# Patient Record
Sex: Female | Born: 1970 | Race: White | Hispanic: No | Marital: Married | Smoking: Former smoker
Health system: Southern US, Community
[De-identification: ages and names within clinical notes are randomized; demographics above are authoritative.]

## PROBLEM LIST (undated history)

## (undated) DIAGNOSIS — J302 Other seasonal allergic rhinitis: Secondary | ICD-10-CM

## (undated) DIAGNOSIS — D171 Benign lipomatous neoplasm of skin and subcutaneous tissue of trunk: Secondary | ICD-10-CM

## (undated) DIAGNOSIS — F419 Anxiety disorder, unspecified: Secondary | ICD-10-CM

## (undated) DIAGNOSIS — J45909 Unspecified asthma, uncomplicated: Secondary | ICD-10-CM

## (undated) DIAGNOSIS — N63 Unspecified lump in unspecified breast: Secondary | ICD-10-CM

## (undated) DIAGNOSIS — K900 Celiac disease: Secondary | ICD-10-CM

## (undated) DIAGNOSIS — D649 Anemia, unspecified: Secondary | ICD-10-CM

## (undated) DIAGNOSIS — E785 Hyperlipidemia, unspecified: Secondary | ICD-10-CM

## (undated) HISTORY — PX: BREAST SURGERY: SHX581

## (undated) HISTORY — DX: Hyperlipidemia, unspecified: E78.5

## (undated) HISTORY — PX: WISDOM TOOTH EXTRACTION: SHX21

## (undated) HISTORY — DX: Unspecified asthma, uncomplicated: J45.909

## (undated) HISTORY — DX: Anemia, unspecified: D64.9

## (undated) HISTORY — DX: Other seasonal allergic rhinitis: J30.2

---

## 2004-06-13 ENCOUNTER — Other Ambulatory Visit: Admission: RE | Admit: 2004-06-13 | Discharge: 2004-06-13 | Payer: Self-pay | Admitting: Obstetrics and Gynecology

## 2005-02-05 ENCOUNTER — Encounter: Admission: RE | Admit: 2005-02-05 | Discharge: 2005-05-06 | Payer: Self-pay | Admitting: Neurosurgery

## 2005-02-11 HISTORY — PX: PLACEMENT OF BREAST IMPLANTS: SHX6334

## 2008-09-07 ENCOUNTER — Inpatient Hospital Stay (HOSPITAL_COMMUNITY): Admission: AD | Admit: 2008-09-07 | Discharge: 2008-09-10 | Payer: Self-pay | Admitting: Obstetrics and Gynecology

## 2009-02-11 HISTORY — PX: COLONOSCOPY: SHX174

## 2009-05-23 ENCOUNTER — Ambulatory Visit: Payer: Self-pay | Admitting: Gastroenterology

## 2009-05-23 ENCOUNTER — Encounter (INDEPENDENT_AMBULATORY_CARE_PROVIDER_SITE_OTHER): Payer: Self-pay | Admitting: *Deleted

## 2009-05-23 DIAGNOSIS — K625 Hemorrhage of anus and rectum: Secondary | ICD-10-CM | POA: Insufficient documentation

## 2009-05-23 DIAGNOSIS — D509 Iron deficiency anemia, unspecified: Secondary | ICD-10-CM | POA: Insufficient documentation

## 2009-05-31 ENCOUNTER — Telehealth: Payer: Self-pay | Admitting: Gastroenterology

## 2009-06-19 ENCOUNTER — Telehealth: Payer: Self-pay | Admitting: Gastroenterology

## 2009-06-22 ENCOUNTER — Ambulatory Visit (HOSPITAL_COMMUNITY): Admission: RE | Admit: 2009-06-22 | Discharge: 2009-06-22 | Payer: Self-pay | Admitting: Gastroenterology

## 2009-06-22 ENCOUNTER — Ambulatory Visit: Payer: Self-pay | Admitting: Gastroenterology

## 2009-06-26 DIAGNOSIS — K900 Celiac disease: Secondary | ICD-10-CM

## 2010-03-13 NOTE — Progress Notes (Signed)
Summary: Triage  Phone Note Call from Patient Call back at Home Phone [phone] Call back at [phone] Cell   Caller: Patient Call For: Dr. Ardis Hughs Reason for Call: Talk to Nurse Summary of Call: Wants to know if the "pill form" can be prescribed instead of the Moviprep Initial call taken by: Webb Laws,  May 31, 2009 8:08 AM  Follow-up for Phone Call        Explained that their is a kidney risk to the Osmo prep.  She agreed to use the Movi Prep Follow-up by: Christian Mate CMA Deborra Medina),  May 31, 2009 9:16 AM

## 2010-03-13 NOTE — Procedures (Signed)
Summary: Upper Endoscopy  Patient: [name] Note: All result statuses are Final unless otherwise noted.  Tests: (1) Upper Endoscopy (EGD)   EGD Upper Endoscopy       DONE     [facility]     [place], [place]  [address]           ENDOSCOPY PROCEDURE REPORT           PATIENT:  Denise Arellano, Denise Arellano  MR#:  [id number]     BIRTHDATE:  [birth year], 38 yrs. old  GENDER:  female     ENDOSCOPIST:  Milus Banister, MD     Referred by:  Janalyn Rouse, M.D.     PROCEDURE DATE:  06/22/2009     PROCEDURE:  EGD with biopsy     ASA CLASS:  Class I     INDICATIONS:  anemia, iron def., eqivocal Celiac Sprue markers     MEDICATIONS:  MAC sedation, administered by CRNA     TOPICAL ANESTHETIC:  none           DESCRIPTION OF PROCEDURE:   After the risks benefits and     alternatives of the procedure were thoroughly explained, informed     consent was obtained.  The  endoscope was introduced through the     mouth and advanced to the second portion of the duodenum, without     limitations.  The instrument was slowly withdrawn as the mucosa     was fully examined.     <<PROCEDUREIMAGES>>     The duodenal mucosa was slightly abnormal appearing, atrophic but     not clearly "scallopped." Biopsies were taken to check for Celiac     Sprue (see image3).  Otherwise the examination was normal (see     image1, image2, image3, and image5).    Retroflexed views revealed     no abnormalities.    The scope was then withdrawn from the patient     and the procedure completed.           COMPLICATIONS:  None           ENDOSCOPIC IMPRESSION:     1) Slightly abnormal appearing duodenal mucosa; biopsies taken     to check for Celiac Sprue     2) Otherwise normal examination           RECOMMENDATIONS:     If biopsies confirm Celiac Sprue, she will be referred to     dietician to educate on gluten avoidance.           ______________________________     Milus Banister, MD          n.     eSIGNED:   Milus Banister at 06/22/2009 09:51 AM           Milana Huntsman, [id number]  Note: An exclamation mark (!) indicates a result that was not dispersed into the flowsheet. Document Creation Date: 06/22/2009 9:51 AM _______________________________________________________________________  (1) Order result status: Final Collection or observation date-time: 06/22/2009 09:45 Requested date-time:  Receipt date-time:  Reported date-time:  Referring Physician:   Ordering Physician: Owens Loffler [phone]) Specimen Source:  Source: Tawanna Cooler Order Number: [id number] Lab site:

## 2010-03-13 NOTE — Letter (Signed)
Summary: [facility] Instructions  [facility]  [place], [place] [address]   Phone: [phone]  Fax: [phone]       VERDELLE VALTIERRA    11-28-1970    MRN: [id number]        Procedure Day /Date:06/22/09 THUR     Arrival Time:830 am     Procedure Time:1030 am     Location of Procedure:                     X  [facility] ( Outpatient Registration)                        [place]   Starting 5 days prior to your procedure 06/17/09  do not eat nuts, seeds, popcorn, corn, beans, peas,  salads, or any raw vegetables.  Do not take any fiber supplements (e.g. Metamucil, Citrucel, and Benefiber).  THE DAY BEFORE YOUR PROCEDURE         DATE: 06/21/09   DAY: WED  1.  Drink clear liquids the entire day-NO SOLID FOOD  2.  Do not drink anything colored red or purple.  Avoid juices with pulp.  No orange juice.  3.  Drink at least 64 oz. (8 glasses) of fluid/clear liquids during the day to prevent dehydration and help the prep work efficiently.  CLEAR LIQUIDS INCLUDE: Water Jello Ice Popsicles Tea (sugar ok, no milk/cream) Powdered fruit flavored drinks Coffee (sugar ok, no milk/cream) Gatorade Juice: apple, white grape, white cranberry  Lemonade Clear bullion, consomm, broth Carbonated beverages (any kind) Strained chicken noodle soup Hard Candy                             4.  In the morning, mix first dose of MoviPrep solution:    Empty 1 Pouch A and 1 Pouch B into the disposable container    Add lukewarm drinking water to the top line of the container. Mix to dissolve    Refrigerate (mixed solution should be used within 24 hrs)  5.  Begin drinking the prep at 5:00 p.m. The MoviPrep container is divided by 4 marks.   Every 15 minutes drink the solution down to the next mark (approximately 8 oz) until the full liter is complete.   6.  Follow completed prep with 16 oz of clear liquid of your choice (Nothing  red or purple).  Continue to drink clear liquids until bedtime.  7.  Before going to bed, mix second dose of MoviPrep solution:    Empty 1 Pouch A and 1 Pouch B into the disposable container    Add lukewarm drinking water to the top line of the container. Mix to dissolve    Refrigerate  THE DAY OF YOUR PROCEDURE      DATE: 06/22/09 DAY: THURS  Beginning at 530 a.m. (5 hours before procedure):         1. Every 15 minutes, drink the solution down to the next mark (approx 8 oz) until the full liter is complete.  NOTHING TO EAT OR DRINK AFTER MIDNIGHT EXCEPT PREP  MEDICATION INSTRUCTIONS  Unless otherwise instructed, you should take regular prescription medications with a small sip of water   as early as possible the morning of your procedure.           OTHER INSTRUCTIONS  You will need a responsible adult  at least 38 years of age to accompany you and drive you home.   This person must remain in the waiting room during your procedure.  Wear loose fitting clothing that is easily removed.  Leave jewelry and other valuables at home.  However, you may wish to bring a book to read or  an iPod/MP3 player to listen to music as you wait for your procedure to start.  Remove all body piercing jewelry and leave at home.  Total time from sign-in until discharge is approximately 2-3 hours.  You should go home directly after your procedure and rest.  You can resume normal activities the  day after your procedure.  The day of your procedure you should not:   Drive   Make legal decisions   Operate machinery   Drink alcohol   Return to work  You will receive specific instructions about eating, activities and medications before you leave.    The above instructions have been reviewed and explained to me by   _______________________    I fully understand and can verbalize these instructions _____________________________ Date _________

## 2010-03-13 NOTE — Procedures (Signed)
Summary: Instructions for procedure/[facility] (out pt)  Instructions for procedure/[facility] (out pt)   Imported By: Phillis Knack 05/29/2009 07:23:51  _____________________________________________________________________  External Attachment:    Type:   Image     Comment:   External Document

## 2010-03-13 NOTE — Progress Notes (Signed)
Summary: prep ?'s  Phone Note Call from Patient Call back at Home Phone [phone]   Caller: Patient Call For: Dr. Ardis Hughs Reason for Call: Talk to Nurse Summary of Call: has prep ?'s... ECL Thursday Initial call taken by: Lucien Mons,  Jun 19, 2009 9:48 AM  Follow-up for Phone Call        left message on machine to call back  [place] Deborra Medina)  Jun 19, 2009 9:58 AM   left message on machine to call back [place] Deborra Medina)  Jun 19, 2009 4:38 PM   left message on machine to call back [place] Deborra Medina)  Jun 20, 2009 10:54 AM

## 2010-03-13 NOTE — Assessment & Plan Note (Signed)
History of Present Illness Visit Type: Initial Consult Primary GI MD: Owens Loffler MD Primary Provider: Lutricia Feil, MD Requesting Provider: Lutricia Feil, MD Chief Complaint: Iron Def Anemia, ? sprue History of Present Illness:     very pleasant 40 year old woman who has "always had low iron" maybe as far back as 20 years ago.  never too low to be anemic.  Has 'regular' mentral cycle.  Has red rectal bleeding with BM about 3 times a year (she feels a hemorrhoidal bump at that time, has anal discomfort).  Once it was thrombosed and had it lanced by Dr. Stark Klein.  Is generally a bit constipated.  but fluctuates.  she had recent blood tests by her primary care physician again documenting low ferritin despite iron replacement. Her ferritin was 5.8. Her hemoglobin was normal her MCV was normal. TTG, IgG antibodies were normal, and the endomysial antibodies were positive at a one:160 titer.  has had some hair loss, has usually been around time of pregnancy.  Saw dermatologist.  Has been an issue for years, seems like more loss lately.           Current Medications (verified): 1)  Ferrex 150 Forte 150-25-1 Mg-Mcg-Mg Caps (Iron Polysacch Cmplx-B12-Fa) .... Take One By Mouth Two Times A Day 2)  Venlafaxine Hcl 75 Mg Xr24h-Cap (Venlafaxine Hcl) .... Take One By Mouth Once Daily 3)  Claritin 10 Mg Tabs (Loratadine) .... Take One By Mouth Once Daily As Needed  Allergies (verified): 1)  ! Asa 2)  ! 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  Review of Systems       Pertinent positive and negative  review of systems were noted in the above HPI and GI specific review of systems.  All other review of systems was otherwise negative.   Vital Signs:  Patient profile:   40 year old female Height:      61 inches Weight:      104.8 pounds BMI:     19.87 Pulse rate:   82 / minute Pulse rhythm:   regular BP sitting:   104 / 58  (left arm) Cuff size:   regular  Vitals Entered By: Bernita Buffy CMA Deborra Medina) (May 23, 2009 9:32 AM)  Physical Exam  Additional Exam:  Constitutional: generally well appearing Psychiatric: alert and oriented times 3 Eyes: extraocular movements intact Mouth: oropharynx moist, no lesions Neck: supple, no lymphadenopathy Cardiovascular: heart regular rate and rythm Lungs: CTA bilaterally Abdomen: soft, non-tender, non-distended, no obvious ascites, no peritoneal signs, normal bowel sounds Extremities: no lower extremity edema bilaterally Skin: no lesions on visible extremities    Impression & Recommendations:  Problem # 1:  Iron deficiency, intermittent rectal bleeding, abnormal celiac sprue testing 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 She has seen intermittent rectal bleeding. This occurs about 3 times a year. It is almost certainly hemorrhoidal however I think we should proceed with full colonoscopy at the same time as her upper endoscopy to make sure there is nothing more serious going on such as neoplasm. This will also be important in  case she does not have sprue proven by biopsy. We will do this at [facility], propofol sedation. She thinks she'll be very difficult to sedate given her anxiety  Patient Instructions: 1)  You will be scheduled to have a colonoscopy at [facility] with anesthesia assistance. 2)  You will be scheduled to have an upper endoscopy. 3)  A copy of this information will be sent to Dr. Lang Snow. 4)  The medication list was reviewed and  reconciled.  All changed / newly prescribed medications were explained.  A complete medication list was provided to the patient / caregiver.  Appended Document: Orders Update/movi    Clinical Lists Changes  Problems: Added new problem of RECTAL BLEEDING (ICD-569.3) Added new problem of ANEMIA, IRON DEFICIENCY (ICD-280.9) Medications: Added new medication of MOVIPREP 100 GM  SOLR (PEG-KCL-NACL-NASULF-NA ASC-C) As per prep instructions. - Signed Rx of MOVIPREP 100 GM  SOLR (PEG-KCL-NACL-NASULF-NA ASC-C) As per prep instructions.;  #1 x 0;  Signed;  Entered by: Christian Mate CMA (AAMA);  Authorized by: Milus Banister MD;  Method used: Electronically to [facility]  [phone]*, [address] [place], [place]  [address], Ph: [phone] or [phone], Fax: [phone] Orders: Added new Test order of [facility] - Signed    Prescriptions: MOVIPREP 100 GM  SOLR (PEG-KCL-NACL-NASULF-NA ASC-C) As per prep instructions.  #1 x 0   Entered by:   Christian Mate CMA ([place])   Authorized by:   Milus Banister MD   Signed by:   Christian Mate CMA ([place]) on 05/23/2009   Method used:   Electronically to        [facility]  [phone]* (retail)       [place], [place]  [address]       Ph: [phone] or [phone]       Fax: [phone]   RxID:   [id number]

## 2010-03-13 NOTE — Procedures (Signed)
Summary: Colonoscopy  Patient: Denise Arellano Note: All result statuses are Final unless otherwise noted.  Tests: (1) Colonoscopy (COL)   COL Colonoscopy           [facility]     [place], [place]  [address]           COLONOSCOPY PROCEDURE REPORT           PATIENT:  Denise, Arellano  MR#:  [id number]     BIRTHDATE:  [birth year], 38 yrs. old  GENDER:  female     ENDOSCOPIST:  Milus Banister, MD     PROCEDURE DATE:  06/22/2009     PROCEDURE:  Colonoscopy with biopsy     ASA CLASS:  Class I     INDICATIONS:  intermittent rectal bleeding, IDA     MEDICATIONS:   MAC sedation, administered by CRNA           DESCRIPTION OF PROCEDURE:   After the risks benefits and     alternatives of the procedure were thoroughly explained, informed     consent was obtained.  Digital rectal exam was performed and     revealed no rectal masses.   The  endoscope was introduced through     the anus and advanced to the terminal ileum which was intubated     for a short distance, without limitations.  The quality of the     prep was good, using MoviPrep.  The instrument was then slowly     withdrawn as the colon was fully examined.     <<PROCEDUREIMAGES>>           FINDINGS:  Internal hemorrhoids were found. These were small, not     thrombosed.  The terminal ileum appeared normal (see image3).     This was otherwise a normal examination of the colon. Random     biopsies were taken from colon to check for microscopic colitis     (sent to path) (see image2 and image4).   Retroflexed views in the     rectum revealed no abnormalities.    The scope was then withdrawn     from the patient and the procedure completed.           COMPLICATIONS:  None           ENDOSCOPIC IMPRESSION:     1) Small, internal hemorrhoids. These are the likely source of     her mild intermittent rectal bleeding.     2) Normal terminal ileum     3) Otherwise normal examination; biopsies taken  to check for     microsopic colitis           RECOMMENDATIONS:     Will proceed with EGD now.     If biopsies show microscopic colitis, will appropriately treat.                 ______________________________     Milus Banister, MD           cc: Lang Snow, MD           n.     eSIGNED:   Milus Banister at 06/22/2009 09:56 AM           Milana Huntsman, [id number]  Note: An exclamation mark (!) indicates a result that was not dispersed into the flowsheet. Document Creation Date: 06/22/2009 9:56 AM _______________________________________________________________________  (1) Order  result status: Final Collection or observation date-time: 06/22/2009 09:37 Requested date-time:  Receipt date-time:  Reported date-time:  Referring Physician:   Ordering Physician: Owens Loffler [phone]) Specimen Source:  Source: Tawanna Cooler Order Number: [id number] Lab site:

## 2010-05-20 LAB — CBC
HCT: 32.4 % — ABNORMAL LOW (ref 36.0–46.0)
Hemoglobin: 10.6 g/dL — ABNORMAL LOW (ref 12.0–15.0)
Hemoglobin: 9 g/dL — ABNORMAL LOW (ref 12.0–15.0)
MCHC: 32.7 g/dL (ref 30.0–36.0)
MCV: 77.8 fL — ABNORMAL LOW (ref 78.0–100.0)
Platelets: 376 K/uL (ref 150–400)
RBC: 3.51 MIL/uL — ABNORMAL LOW (ref 3.87–5.11)
RBC: 4.17 MIL/uL (ref 3.87–5.11)
RDW: 16.4 % — ABNORMAL HIGH (ref 11.5–15.5)
WBC: 14.1 K/uL — ABNORMAL HIGH (ref 4.0–10.5)
WBC: 16.3 10*3/uL — ABNORMAL HIGH (ref 4.0–10.5)

## 2010-05-20 LAB — RPR: RPR Ser Ql: NONREACTIVE

## 2012-04-07 ENCOUNTER — Other Ambulatory Visit: Payer: Self-pay | Admitting: Obstetrics and Gynecology

## 2013-09-08 ENCOUNTER — Other Ambulatory Visit: Payer: Self-pay | Admitting: Obstetrics and Gynecology

## 2013-09-09 LAB — CYTOLOGY - PAP

## 2015-08-04 DIAGNOSIS — H6123 Impacted cerumen, bilateral: Secondary | ICD-10-CM | POA: Diagnosis not present

## 2015-10-03 DIAGNOSIS — Z6821 Body mass index (BMI) 21.0-21.9, adult: Secondary | ICD-10-CM | POA: Diagnosis not present

## 2015-10-03 DIAGNOSIS — J208 Acute bronchitis due to other specified organisms: Secondary | ICD-10-CM | POA: Diagnosis not present

## 2016-03-01 DIAGNOSIS — J00 Acute nasopharyngitis [common cold]: Secondary | ICD-10-CM | POA: Diagnosis not present

## 2016-03-01 DIAGNOSIS — J111 Influenza due to unidentified influenza virus with other respiratory manifestations: Secondary | ICD-10-CM | POA: Diagnosis not present

## 2016-03-20 DIAGNOSIS — M9905 Segmental and somatic dysfunction of pelvic region: Secondary | ICD-10-CM | POA: Diagnosis not present

## 2016-03-20 DIAGNOSIS — M9903 Segmental and somatic dysfunction of lumbar region: Secondary | ICD-10-CM | POA: Diagnosis not present

## 2016-03-20 DIAGNOSIS — M25551 Pain in right hip: Secondary | ICD-10-CM | POA: Diagnosis not present

## 2016-03-20 DIAGNOSIS — M5136 Other intervertebral disc degeneration, lumbar region: Secondary | ICD-10-CM | POA: Diagnosis not present

## 2016-03-21 DIAGNOSIS — M25551 Pain in right hip: Secondary | ICD-10-CM | POA: Diagnosis not present

## 2016-03-21 DIAGNOSIS — M5136 Other intervertebral disc degeneration, lumbar region: Secondary | ICD-10-CM | POA: Diagnosis not present

## 2016-03-21 DIAGNOSIS — M9903 Segmental and somatic dysfunction of lumbar region: Secondary | ICD-10-CM | POA: Diagnosis not present

## 2016-03-21 DIAGNOSIS — M9905 Segmental and somatic dysfunction of pelvic region: Secondary | ICD-10-CM | POA: Diagnosis not present

## 2016-03-22 DIAGNOSIS — M9903 Segmental and somatic dysfunction of lumbar region: Secondary | ICD-10-CM | POA: Diagnosis not present

## 2016-03-22 DIAGNOSIS — M5136 Other intervertebral disc degeneration, lumbar region: Secondary | ICD-10-CM | POA: Diagnosis not present

## 2016-03-22 DIAGNOSIS — M25551 Pain in right hip: Secondary | ICD-10-CM | POA: Diagnosis not present

## 2016-03-22 DIAGNOSIS — M9905 Segmental and somatic dysfunction of pelvic region: Secondary | ICD-10-CM | POA: Diagnosis not present

## 2016-03-26 DIAGNOSIS — M25551 Pain in right hip: Secondary | ICD-10-CM | POA: Diagnosis not present

## 2016-03-26 DIAGNOSIS — M5136 Other intervertebral disc degeneration, lumbar region: Secondary | ICD-10-CM | POA: Diagnosis not present

## 2016-03-26 DIAGNOSIS — M9903 Segmental and somatic dysfunction of lumbar region: Secondary | ICD-10-CM | POA: Diagnosis not present

## 2016-03-26 DIAGNOSIS — M9905 Segmental and somatic dysfunction of pelvic region: Secondary | ICD-10-CM | POA: Diagnosis not present

## 2016-03-28 DIAGNOSIS — M9905 Segmental and somatic dysfunction of pelvic region: Secondary | ICD-10-CM | POA: Diagnosis not present

## 2016-03-28 DIAGNOSIS — M9903 Segmental and somatic dysfunction of lumbar region: Secondary | ICD-10-CM | POA: Diagnosis not present

## 2016-03-28 DIAGNOSIS — M5136 Other intervertebral disc degeneration, lumbar region: Secondary | ICD-10-CM | POA: Diagnosis not present

## 2016-03-28 DIAGNOSIS — M25551 Pain in right hip: Secondary | ICD-10-CM | POA: Diagnosis not present

## 2016-04-02 DIAGNOSIS — M25551 Pain in right hip: Secondary | ICD-10-CM | POA: Diagnosis not present

## 2016-04-02 DIAGNOSIS — M9903 Segmental and somatic dysfunction of lumbar region: Secondary | ICD-10-CM | POA: Diagnosis not present

## 2016-04-02 DIAGNOSIS — M9905 Segmental and somatic dysfunction of pelvic region: Secondary | ICD-10-CM | POA: Diagnosis not present

## 2016-04-02 DIAGNOSIS — M5136 Other intervertebral disc degeneration, lumbar region: Secondary | ICD-10-CM | POA: Diagnosis not present

## 2016-04-03 DIAGNOSIS — M9905 Segmental and somatic dysfunction of pelvic region: Secondary | ICD-10-CM | POA: Diagnosis not present

## 2016-04-03 DIAGNOSIS — M25551 Pain in right hip: Secondary | ICD-10-CM | POA: Diagnosis not present

## 2016-04-03 DIAGNOSIS — M5136 Other intervertebral disc degeneration, lumbar region: Secondary | ICD-10-CM | POA: Diagnosis not present

## 2016-04-03 DIAGNOSIS — M9903 Segmental and somatic dysfunction of lumbar region: Secondary | ICD-10-CM | POA: Diagnosis not present

## 2016-04-05 DIAGNOSIS — M25551 Pain in right hip: Secondary | ICD-10-CM | POA: Diagnosis not present

## 2016-04-05 DIAGNOSIS — M9905 Segmental and somatic dysfunction of pelvic region: Secondary | ICD-10-CM | POA: Diagnosis not present

## 2016-04-05 DIAGNOSIS — M5136 Other intervertebral disc degeneration, lumbar region: Secondary | ICD-10-CM | POA: Diagnosis not present

## 2016-04-05 DIAGNOSIS — M9903 Segmental and somatic dysfunction of lumbar region: Secondary | ICD-10-CM | POA: Diagnosis not present

## 2016-04-08 DIAGNOSIS — M9903 Segmental and somatic dysfunction of lumbar region: Secondary | ICD-10-CM | POA: Diagnosis not present

## 2016-04-08 DIAGNOSIS — M25551 Pain in right hip: Secondary | ICD-10-CM | POA: Diagnosis not present

## 2016-04-08 DIAGNOSIS — M9905 Segmental and somatic dysfunction of pelvic region: Secondary | ICD-10-CM | POA: Diagnosis not present

## 2016-04-08 DIAGNOSIS — M5136 Other intervertebral disc degeneration, lumbar region: Secondary | ICD-10-CM | POA: Diagnosis not present

## 2016-04-10 DIAGNOSIS — M9905 Segmental and somatic dysfunction of pelvic region: Secondary | ICD-10-CM | POA: Diagnosis not present

## 2016-04-10 DIAGNOSIS — M25551 Pain in right hip: Secondary | ICD-10-CM | POA: Diagnosis not present

## 2016-04-10 DIAGNOSIS — M9903 Segmental and somatic dysfunction of lumbar region: Secondary | ICD-10-CM | POA: Diagnosis not present

## 2016-04-10 DIAGNOSIS — M5136 Other intervertebral disc degeneration, lumbar region: Secondary | ICD-10-CM | POA: Diagnosis not present

## 2016-04-12 DIAGNOSIS — M5136 Other intervertebral disc degeneration, lumbar region: Secondary | ICD-10-CM | POA: Diagnosis not present

## 2016-04-12 DIAGNOSIS — M25551 Pain in right hip: Secondary | ICD-10-CM | POA: Diagnosis not present

## 2016-04-12 DIAGNOSIS — M9903 Segmental and somatic dysfunction of lumbar region: Secondary | ICD-10-CM | POA: Diagnosis not present

## 2016-04-12 DIAGNOSIS — M9905 Segmental and somatic dysfunction of pelvic region: Secondary | ICD-10-CM | POA: Diagnosis not present

## 2016-04-15 DIAGNOSIS — M5136 Other intervertebral disc degeneration, lumbar region: Secondary | ICD-10-CM | POA: Diagnosis not present

## 2016-04-15 DIAGNOSIS — M9903 Segmental and somatic dysfunction of lumbar region: Secondary | ICD-10-CM | POA: Diagnosis not present

## 2016-04-15 DIAGNOSIS — M9905 Segmental and somatic dysfunction of pelvic region: Secondary | ICD-10-CM | POA: Diagnosis not present

## 2016-04-15 DIAGNOSIS — M25551 Pain in right hip: Secondary | ICD-10-CM | POA: Diagnosis not present

## 2016-04-17 DIAGNOSIS — M5136 Other intervertebral disc degeneration, lumbar region: Secondary | ICD-10-CM | POA: Diagnosis not present

## 2016-04-17 DIAGNOSIS — M9903 Segmental and somatic dysfunction of lumbar region: Secondary | ICD-10-CM | POA: Diagnosis not present

## 2016-04-17 DIAGNOSIS — M9905 Segmental and somatic dysfunction of pelvic region: Secondary | ICD-10-CM | POA: Diagnosis not present

## 2016-04-17 DIAGNOSIS — M25551 Pain in right hip: Secondary | ICD-10-CM | POA: Diagnosis not present

## 2016-04-19 DIAGNOSIS — M5136 Other intervertebral disc degeneration, lumbar region: Secondary | ICD-10-CM | POA: Diagnosis not present

## 2016-04-19 DIAGNOSIS — M25551 Pain in right hip: Secondary | ICD-10-CM | POA: Diagnosis not present

## 2016-04-19 DIAGNOSIS — M9905 Segmental and somatic dysfunction of pelvic region: Secondary | ICD-10-CM | POA: Diagnosis not present

## 2016-04-19 DIAGNOSIS — M9903 Segmental and somatic dysfunction of lumbar region: Secondary | ICD-10-CM | POA: Diagnosis not present

## 2016-04-24 DIAGNOSIS — M9905 Segmental and somatic dysfunction of pelvic region: Secondary | ICD-10-CM | POA: Diagnosis not present

## 2016-04-24 DIAGNOSIS — M9903 Segmental and somatic dysfunction of lumbar region: Secondary | ICD-10-CM | POA: Diagnosis not present

## 2016-04-24 DIAGNOSIS — M25551 Pain in right hip: Secondary | ICD-10-CM | POA: Diagnosis not present

## 2016-04-24 DIAGNOSIS — M5136 Other intervertebral disc degeneration, lumbar region: Secondary | ICD-10-CM | POA: Diagnosis not present

## 2016-04-26 DIAGNOSIS — M9905 Segmental and somatic dysfunction of pelvic region: Secondary | ICD-10-CM | POA: Diagnosis not present

## 2016-04-26 DIAGNOSIS — M25551 Pain in right hip: Secondary | ICD-10-CM | POA: Diagnosis not present

## 2016-04-26 DIAGNOSIS — M5136 Other intervertebral disc degeneration, lumbar region: Secondary | ICD-10-CM | POA: Diagnosis not present

## 2016-04-26 DIAGNOSIS — M9903 Segmental and somatic dysfunction of lumbar region: Secondary | ICD-10-CM | POA: Diagnosis not present

## 2016-04-29 DIAGNOSIS — M9905 Segmental and somatic dysfunction of pelvic region: Secondary | ICD-10-CM | POA: Diagnosis not present

## 2016-04-29 DIAGNOSIS — M9903 Segmental and somatic dysfunction of lumbar region: Secondary | ICD-10-CM | POA: Diagnosis not present

## 2016-04-29 DIAGNOSIS — M5136 Other intervertebral disc degeneration, lumbar region: Secondary | ICD-10-CM | POA: Diagnosis not present

## 2016-04-29 DIAGNOSIS — M25551 Pain in right hip: Secondary | ICD-10-CM | POA: Diagnosis not present

## 2016-04-30 DIAGNOSIS — D235 Other benign neoplasm of skin of trunk: Secondary | ICD-10-CM | POA: Diagnosis not present

## 2016-04-30 DIAGNOSIS — L282 Other prurigo: Secondary | ICD-10-CM | POA: Diagnosis not present

## 2016-05-01 DIAGNOSIS — M9905 Segmental and somatic dysfunction of pelvic region: Secondary | ICD-10-CM | POA: Diagnosis not present

## 2016-05-01 DIAGNOSIS — M25551 Pain in right hip: Secondary | ICD-10-CM | POA: Diagnosis not present

## 2016-05-01 DIAGNOSIS — M9903 Segmental and somatic dysfunction of lumbar region: Secondary | ICD-10-CM | POA: Diagnosis not present

## 2016-05-01 DIAGNOSIS — M5136 Other intervertebral disc degeneration, lumbar region: Secondary | ICD-10-CM | POA: Diagnosis not present

## 2016-05-03 DIAGNOSIS — M9905 Segmental and somatic dysfunction of pelvic region: Secondary | ICD-10-CM | POA: Diagnosis not present

## 2016-05-03 DIAGNOSIS — M25551 Pain in right hip: Secondary | ICD-10-CM | POA: Diagnosis not present

## 2016-05-03 DIAGNOSIS — M5136 Other intervertebral disc degeneration, lumbar region: Secondary | ICD-10-CM | POA: Diagnosis not present

## 2016-05-03 DIAGNOSIS — M9903 Segmental and somatic dysfunction of lumbar region: Secondary | ICD-10-CM | POA: Diagnosis not present

## 2016-05-06 DIAGNOSIS — M25551 Pain in right hip: Secondary | ICD-10-CM | POA: Diagnosis not present

## 2016-05-06 DIAGNOSIS — M5136 Other intervertebral disc degeneration, lumbar region: Secondary | ICD-10-CM | POA: Diagnosis not present

## 2016-05-06 DIAGNOSIS — M9903 Segmental and somatic dysfunction of lumbar region: Secondary | ICD-10-CM | POA: Diagnosis not present

## 2016-05-06 DIAGNOSIS — M9905 Segmental and somatic dysfunction of pelvic region: Secondary | ICD-10-CM | POA: Diagnosis not present

## 2016-06-11 DIAGNOSIS — L281 Prurigo nodularis: Secondary | ICD-10-CM | POA: Diagnosis not present

## 2016-06-11 DIAGNOSIS — L309 Dermatitis, unspecified: Secondary | ICD-10-CM | POA: Diagnosis not present

## 2016-06-11 DIAGNOSIS — L282 Other prurigo: Secondary | ICD-10-CM | POA: Diagnosis not present

## 2016-06-21 DIAGNOSIS — L2389 Allergic contact dermatitis due to other agents: Secondary | ICD-10-CM | POA: Diagnosis not present

## 2016-06-21 DIAGNOSIS — K900 Celiac disease: Secondary | ICD-10-CM | POA: Diagnosis not present

## 2016-06-21 DIAGNOSIS — Z6823 Body mass index (BMI) 23.0-23.9, adult: Secondary | ICD-10-CM | POA: Diagnosis not present

## 2016-07-09 DIAGNOSIS — L299 Pruritus, unspecified: Secondary | ICD-10-CM | POA: Diagnosis not present

## 2016-07-09 DIAGNOSIS — L309 Dermatitis, unspecified: Secondary | ICD-10-CM | POA: Diagnosis not present

## 2016-07-18 ENCOUNTER — Emergency Department (HOSPITAL_COMMUNITY): Payer: BLUE CROSS/BLUE SHIELD

## 2016-07-18 ENCOUNTER — Emergency Department (HOSPITAL_COMMUNITY)
Admission: EM | Admit: 2016-07-18 | Discharge: 2016-07-18 | Disposition: A | Discharge status: Home / Self Care | Payer: BLUE CROSS/BLUE SHIELD | Attending: Emergency Medicine | Admitting: Emergency Medicine

## 2016-07-18 ENCOUNTER — Encounter (HOSPITAL_COMMUNITY): Payer: Self-pay | Admitting: Emergency Medicine

## 2016-07-18 DIAGNOSIS — Y999 Unspecified external cause status: Secondary | ICD-10-CM | POA: Diagnosis not present

## 2016-07-18 DIAGNOSIS — Z23 Encounter for immunization: Secondary | ICD-10-CM | POA: Insufficient documentation

## 2016-07-18 DIAGNOSIS — Y9389 Activity, other specified: Secondary | ICD-10-CM | POA: Diagnosis not present

## 2016-07-18 DIAGNOSIS — W230XXA Caught, crushed, jammed, or pinched between moving objects, initial encounter: Secondary | ICD-10-CM | POA: Insufficient documentation

## 2016-07-18 DIAGNOSIS — M79645 Pain in left finger(s): Secondary | ICD-10-CM | POA: Diagnosis not present

## 2016-07-18 DIAGNOSIS — Y929 Unspecified place or not applicable: Secondary | ICD-10-CM | POA: Insufficient documentation

## 2016-07-18 DIAGNOSIS — S61211A Laceration without foreign body of left index finger without damage to nail, initial encounter: Secondary | ICD-10-CM | POA: Diagnosis not present

## 2016-07-18 DIAGNOSIS — S6992XA Unspecified injury of left wrist, hand and finger(s), initial encounter: Secondary | ICD-10-CM | POA: Diagnosis not present

## 2016-07-18 MED ORDER — TETANUS-DIPHTH-ACELL PERTUSSIS 5-2.5-18.5 LF-MCG/0.5 IM SUSP
0.5000 mL | Freq: Once | INTRAMUSCULAR | Status: AC
  Administered 2016-07-18: 0.5 mL via INTRAMUSCULAR
  Filled 2016-07-18: qty 0.5

## 2016-07-18 NOTE — ED Provider Notes (Signed)
[facility] DEPT Provider Note    By signing my name below, I, Bea Graff, attest that this documentation has been prepared under the direction and in the presence of Alyse Low, [place]. Electronically Signed: Bea Graff, ED Scribe. 07/18/16. 10:46 AM.    History   Chief Complaint Chief Complaint  Patient presents with  . Laceration    The history is provided by the patient and medical records. No language interpreter was used.    Denise Arellano is a 46 y.o. female who presents to the Emergency Department complaining of a laceration to the left index finger that occurred PTA. She reports associated severe pain and bleeding that has now been controlled. She rates the pain at 7/10. She has not taken anything for pain. Touching or moving the finger increases the pain. She denies alleviating factors. She denies LOC, head trauma, fever, chills, nausea, vomiting, numbness or tingling of the left finger or hand. She is not on anticoagulant therapy. She is unsure of her last tetanus.    History reviewed. No pertinent past medical history.  Patient Active Problem List   Diagnosis Date Noted  . 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 ANEMIA, IRON DEFICIENCY 05/23/2009  . RECTAL BLEEDING 05/23/2009    History reviewed. No pertinent surgical history.  OB History    No data available       Home Medications    Prior to Admission medications   Not on File    Family History No family history on file.  Social History Social History  Substance Use Topics  . Smoking status: Not on file  . Smokeless tobacco: Not on file  . Alcohol use Not on file     Allergies   Aspirin and Ibuprofen   Review of Systems Review of Systems  Constitutional: Negative for chills and fever.  Gastrointestinal: Negative for nausea and vomiting.  Musculoskeletal: Positive for arthralgias.  Skin: Positive for wound.  Neurological: Negative for syncope, weakness and numbness.     Physical  Exam Updated Vital Signs BP (!) 127/93   Pulse 76   Temp 97.9 F (36.6 C) (Oral)   Resp 16   SpO2 100%   Physical Exam  Constitutional: She is oriented to person, place, and time. She appears well-developed and well-nourished.  HENT:  Head: Normocephalic and atraumatic.  Neck: Normal range of motion.  Cardiovascular: Normal rate, regular rhythm and normal heart sounds.   Cap refill of left index finger intact.  Pulmonary/Chest: Effort normal and breath sounds normal. No respiratory distress.  Musculoskeletal: Normal range of motion. She exhibits tenderness. She exhibits no edema or deformity.  Able to extend DIP of second finger of left hand. 1 cm laceration distal dorsal aspect of left index. Full ROM.  Neurological: She is alert and oriented to person, place, and time.  NVI. Neurosensory intact.  Skin: Skin is warm and dry.  Psychiatric: She has a normal mood and affect. Her behavior is normal.  Nursing note and vitals reviewed.    ED Treatments / Results  DIAGNOSTIC STUDIES: Oxygen Saturation is 100% on RA, normal by my interpretation.   COORDINATION OF CARE: 9:29 AM- Will order imaging and update tetanus vaccination. Will Dermabond laceration. Pt verbalizes understanding and agrees to plan.  Medications  Tdap (BOOSTRIX) injection 0.5 mL (0.5 mLs Intramuscular Given 07/18/16 0958)    Labs (all labs ordered are listed, but only abnormal results are displayed) Labs Reviewed - No data to display  EKG  EKG Interpretation None  Radiology Dg Finger Index Left  Result Date: 07/18/2016 CLINICAL DATA:  Injury.  Pain . EXAM: LEFT INDEX FINGER 2+V COMPARISON:  No prior . FINDINGS: No acute bony or joint abnormality identified. No evidence of fracture. Soft tissue laceration appears to be present. No radiopaque foreign body . IMPRESSION: Soft tissue laceration appears to be present. No radiopaque foreign body. No acute bony abnormality identified. No evidence of fracture  dislocation Electronically Signed   By: Marcello Moores  Register   On: 07/18/2016 09:49    Procedures .Marland KitchenLaceration Repair Date/Time: 07/18/2016 10:39 AM Performed by: Fransico Meadow Authorized by: Fransico Meadow   Consent:    Consent obtained:  Verbal   Consent given by:  Patient Anesthesia (see MAR for exact dosages):    Anesthesia method:  None Laceration details:    Location:  Finger   Finger location:  L index finger   Length (cm):  1 Repair type:    Repair type:  Simple Pre-procedure details:    Preparation:  Patient was prepped and draped in usual sterile fashion Exploration:    Hemostasis achieved with:  Direct pressure   Wound exploration: wound explored through full range of motion     Contaminated: no   Treatment:    Wound cleansed with: sure clense.   Amount of cleaning:  Standard   Irrigation solution:  Sterile saline Skin repair:    Repair method:  Tissue adhesive Approximation:    Approximation:  Close   Vermilion border: well-aligned   Post-procedure details:    Dressing:  Sterile dressing   Patient tolerance of procedure:  Tolerated well, no immediate complications     (including critical care time)  Medications Ordered in ED Medications  Tdap (BOOSTRIX) injection 0.5 mL (0.5 mLs Intramuscular Given 07/18/16 0958)     Initial Impression / Assessment and Plan / ED Course  I have reviewed the triage vital signs and the nursing notes.  Pertinent labs & imaging results that were available during my care of the patient were reviewed by me and considered in my medical decision making (see chart for details).     Patient here for left index finger pain secondary to slamming it in a car door PTA. She has a 1 cm laceration to the dorsal distal aspect of the finger. X-Ray negative for obvious fracture or dislocation. Laceration closed with Dermabond. Patient given finger splint while in ED, conservative therapy recommended and discussed. Patient will be discharged  home & is agreeable with above plan. Returns precautions discussed. Pt appears safe for discharge.   Final Clinical Impressions(s) / ED Diagnoses   Final diagnoses:  Laceration of left index finger without foreign body without damage to nail, initial encounter    New Prescriptions There are no discharge medications for this patient.   An After Visit Summary was printed and given to the patient.  I personally performed the services in this documentation, which was scribed in my presence.  The recorded information has been reviewed and considered.   Ronnald Collum.     Fransico Meadow, [place] 07/18/16 1545    Quintella Reichert, MD 07/19/16 [phone]

## 2016-07-18 NOTE — ED Triage Notes (Signed)
Pt reports slamming her left index finger in the car door this am, pt has laceration to finger.

## 2016-07-18 NOTE — ED Notes (Signed)
See EDP secondary assessment.

## 2016-07-18 NOTE — ED Notes (Signed)
Patient transported to X-ray 

## 2016-07-27 DIAGNOSIS — S6710XA Crushing injury of unspecified finger(s), initial encounter: Secondary | ICD-10-CM | POA: Diagnosis not present

## 2016-07-30 DIAGNOSIS — L309 Dermatitis, unspecified: Secondary | ICD-10-CM | POA: Diagnosis not present

## 2016-07-30 DIAGNOSIS — L282 Other prurigo: Secondary | ICD-10-CM | POA: Diagnosis not present

## 2016-09-28 DIAGNOSIS — S63501A Unspecified sprain of right wrist, initial encounter: Secondary | ICD-10-CM | POA: Diagnosis not present

## 2016-09-28 DIAGNOSIS — S6391XA Sprain of unspecified part of right wrist and hand, initial encounter: Secondary | ICD-10-CM | POA: Diagnosis not present

## 2017-01-07 DIAGNOSIS — J029 Acute pharyngitis, unspecified: Secondary | ICD-10-CM | POA: Diagnosis not present

## 2017-02-20 DIAGNOSIS — Z01419 Encounter for gynecological examination (general) (routine) without abnormal findings: Secondary | ICD-10-CM | POA: Diagnosis not present

## 2017-02-20 DIAGNOSIS — Z1231 Encounter for screening mammogram for malignant neoplasm of breast: Secondary | ICD-10-CM | POA: Diagnosis not present

## 2017-02-20 DIAGNOSIS — Z6823 Body mass index (BMI) 23.0-23.9, adult: Secondary | ICD-10-CM | POA: Diagnosis not present

## 2017-04-29 DIAGNOSIS — M2022 Hallux rigidus, left foot: Secondary | ICD-10-CM | POA: Diagnosis not present

## 2017-04-29 DIAGNOSIS — M2021 Hallux rigidus, right foot: Secondary | ICD-10-CM | POA: Diagnosis not present

## 2017-06-09 DIAGNOSIS — W57XXXA Bitten or stung by nonvenomous insect and other nonvenomous arthropods, initial encounter: Secondary | ICD-10-CM | POA: Diagnosis not present

## 2017-06-09 DIAGNOSIS — L509 Urticaria, unspecified: Secondary | ICD-10-CM | POA: Diagnosis not present

## 2017-10-08 DIAGNOSIS — M79671 Pain in right foot: Secondary | ICD-10-CM | POA: Diagnosis not present

## 2017-10-08 DIAGNOSIS — M2021 Hallux rigidus, right foot: Secondary | ICD-10-CM | POA: Diagnosis not present

## 2018-02-11 HISTORY — PX: SHOULDER SURGERY: SHX246

## 2018-04-15 DIAGNOSIS — Z0000 Encounter for general adult medical examination without abnormal findings: Secondary | ICD-10-CM | POA: Diagnosis not present

## 2018-04-15 DIAGNOSIS — D509 Iron deficiency anemia, unspecified: Secondary | ICD-10-CM | POA: Diagnosis not present

## 2018-04-21 DIAGNOSIS — Z1212 Encounter for screening for malignant neoplasm of rectum: Secondary | ICD-10-CM | POA: Diagnosis not present

## 2018-04-22 DIAGNOSIS — Z0000 Encounter for general adult medical examination without abnormal findings: Secondary | ICD-10-CM | POA: Diagnosis not present

## 2018-04-22 DIAGNOSIS — N924 Excessive bleeding in the premenopausal period: Secondary | ICD-10-CM | POA: Diagnosis not present

## 2018-04-22 DIAGNOSIS — M2550 Pain in unspecified joint: Secondary | ICD-10-CM | POA: Diagnosis not present

## 2018-04-22 DIAGNOSIS — K900 Celiac disease: Secondary | ICD-10-CM | POA: Diagnosis not present

## 2018-04-22 DIAGNOSIS — Z1331 Encounter for screening for depression: Secondary | ICD-10-CM | POA: Diagnosis not present

## 2018-04-22 DIAGNOSIS — D509 Iron deficiency anemia, unspecified: Secondary | ICD-10-CM | POA: Diagnosis not present

## 2018-04-22 DIAGNOSIS — R5383 Other fatigue: Secondary | ICD-10-CM | POA: Diagnosis not present

## 2018-06-01 DIAGNOSIS — Z1231 Encounter for screening mammogram for malignant neoplasm of breast: Secondary | ICD-10-CM | POA: Diagnosis not present

## 2018-06-01 DIAGNOSIS — Z6822 Body mass index (BMI) 22.0-22.9, adult: Secondary | ICD-10-CM | POA: Diagnosis not present

## 2018-06-01 DIAGNOSIS — Z01419 Encounter for gynecological examination (general) (routine) without abnormal findings: Secondary | ICD-10-CM | POA: Diagnosis not present

## 2018-06-01 DIAGNOSIS — N920 Excessive and frequent menstruation with regular cycle: Secondary | ICD-10-CM | POA: Diagnosis not present

## 2018-06-03 ENCOUNTER — Other Ambulatory Visit: Payer: Self-pay | Admitting: Obstetrics and Gynecology

## 2018-06-03 DIAGNOSIS — R928 Other abnormal and inconclusive findings on diagnostic imaging of breast: Secondary | ICD-10-CM

## 2018-06-05 ENCOUNTER — Ambulatory Visit
Admission: RE | Admit: 2018-06-05 | Discharge: 2018-06-05 | Disposition: A | Discharge status: Home / Self Care | Payer: BLUE CROSS/BLUE SHIELD | Source: Ambulatory Visit | Attending: Obstetrics and Gynecology | Admitting: Obstetrics and Gynecology

## 2018-06-05 ENCOUNTER — Ambulatory Visit: Payer: BLUE CROSS/BLUE SHIELD

## 2018-06-05 ENCOUNTER — Other Ambulatory Visit: Payer: Self-pay

## 2018-06-05 DIAGNOSIS — R928 Other abnormal and inconclusive findings on diagnostic imaging of breast: Secondary | ICD-10-CM

## 2018-07-09 DIAGNOSIS — N920 Excessive and frequent menstruation with regular cycle: Secondary | ICD-10-CM | POA: Diagnosis not present

## 2018-07-09 DIAGNOSIS — N840 Polyp of corpus uteri: Secondary | ICD-10-CM | POA: Diagnosis not present

## 2018-10-24 DIAGNOSIS — M7521 Bicipital tendinitis, right shoulder: Secondary | ICD-10-CM | POA: Diagnosis not present

## 2018-10-28 DIAGNOSIS — M25511 Pain in right shoulder: Secondary | ICD-10-CM | POA: Diagnosis not present

## 2018-10-28 DIAGNOSIS — M6281 Muscle weakness (generalized): Secondary | ICD-10-CM | POA: Diagnosis not present

## 2018-10-28 DIAGNOSIS — S46011D Strain of muscle(s) and tendon(s) of the rotator cuff of right shoulder, subsequent encounter: Secondary | ICD-10-CM | POA: Diagnosis not present

## 2018-11-11 DIAGNOSIS — M6281 Muscle weakness (generalized): Secondary | ICD-10-CM | POA: Diagnosis not present

## 2018-11-11 DIAGNOSIS — M25511 Pain in right shoulder: Secondary | ICD-10-CM | POA: Diagnosis not present

## 2018-11-11 DIAGNOSIS — S46011D Strain of muscle(s) and tendon(s) of the rotator cuff of right shoulder, subsequent encounter: Secondary | ICD-10-CM | POA: Diagnosis not present

## 2018-11-17 ENCOUNTER — Other Ambulatory Visit: Payer: Self-pay

## 2018-11-17 DIAGNOSIS — Z20822 Contact with and (suspected) exposure to covid-19: Secondary | ICD-10-CM

## 2018-11-17 DIAGNOSIS — Z20828 Contact with and (suspected) exposure to other viral communicable diseases: Secondary | ICD-10-CM | POA: Diagnosis not present

## 2018-11-19 LAB — NOVEL CORONAVIRUS, NAA: SARS-CoV-2, NAA: NOT DETECTED

## 2018-11-24 DIAGNOSIS — S43431A Superior glenoid labrum lesion of right shoulder, initial encounter: Secondary | ICD-10-CM | POA: Diagnosis not present

## 2018-12-02 DIAGNOSIS — M25511 Pain in right shoulder: Secondary | ICD-10-CM | POA: Diagnosis not present

## 2018-12-08 DIAGNOSIS — S43431D Superior glenoid labrum lesion of right shoulder, subsequent encounter: Secondary | ICD-10-CM | POA: Diagnosis not present

## 2019-01-13 DIAGNOSIS — Y998 Other external cause status: Secondary | ICD-10-CM | POA: Diagnosis not present

## 2019-01-13 DIAGNOSIS — G8918 Other acute postprocedural pain: Secondary | ICD-10-CM | POA: Diagnosis not present

## 2019-01-13 DIAGNOSIS — M7521 Bicipital tendinitis, right shoulder: Secondary | ICD-10-CM | POA: Diagnosis not present

## 2019-01-13 DIAGNOSIS — S43431A Superior glenoid labrum lesion of right shoulder, initial encounter: Secondary | ICD-10-CM | POA: Diagnosis not present

## 2019-01-13 DIAGNOSIS — S46011A Strain of muscle(s) and tendon(s) of the rotator cuff of right shoulder, initial encounter: Secondary | ICD-10-CM | POA: Diagnosis not present

## 2019-01-13 DIAGNOSIS — Y9379 Activity, other specified sports and athletics: Secondary | ICD-10-CM | POA: Diagnosis not present

## 2019-01-13 DIAGNOSIS — S43491A Other sprain of right shoulder joint, initial encounter: Secondary | ICD-10-CM | POA: Diagnosis not present

## 2019-01-13 DIAGNOSIS — X58XXXA Exposure to other specified factors, initial encounter: Secondary | ICD-10-CM | POA: Diagnosis not present

## 2019-01-13 DIAGNOSIS — M24111 Other articular cartilage disorders, right shoulder: Secondary | ICD-10-CM | POA: Diagnosis not present

## 2019-01-13 DIAGNOSIS — M7541 Impingement syndrome of right shoulder: Secondary | ICD-10-CM | POA: Diagnosis not present

## 2019-01-21 DIAGNOSIS — M25511 Pain in right shoulder: Secondary | ICD-10-CM | POA: Diagnosis not present

## 2019-01-26 DIAGNOSIS — M25611 Stiffness of right shoulder, not elsewhere classified: Secondary | ICD-10-CM | POA: Diagnosis not present

## 2019-01-26 DIAGNOSIS — M6281 Muscle weakness (generalized): Secondary | ICD-10-CM | POA: Diagnosis not present

## 2019-01-26 DIAGNOSIS — M7521 Bicipital tendinitis, right shoulder: Secondary | ICD-10-CM | POA: Diagnosis not present

## 2019-01-26 DIAGNOSIS — M25511 Pain in right shoulder: Secondary | ICD-10-CM | POA: Diagnosis not present

## 2019-01-28 DIAGNOSIS — M7521 Bicipital tendinitis, right shoulder: Secondary | ICD-10-CM | POA: Diagnosis not present

## 2019-01-28 DIAGNOSIS — M6281 Muscle weakness (generalized): Secondary | ICD-10-CM | POA: Diagnosis not present

## 2019-01-28 DIAGNOSIS — M25611 Stiffness of right shoulder, not elsewhere classified: Secondary | ICD-10-CM | POA: Diagnosis not present

## 2019-01-28 DIAGNOSIS — M25511 Pain in right shoulder: Secondary | ICD-10-CM | POA: Diagnosis not present

## 2019-02-02 DIAGNOSIS — M25511 Pain in right shoulder: Secondary | ICD-10-CM | POA: Diagnosis not present

## 2019-02-02 DIAGNOSIS — M6281 Muscle weakness (generalized): Secondary | ICD-10-CM | POA: Diagnosis not present

## 2019-02-02 DIAGNOSIS — M7521 Bicipital tendinitis, right shoulder: Secondary | ICD-10-CM | POA: Diagnosis not present

## 2019-02-02 DIAGNOSIS — M25611 Stiffness of right shoulder, not elsewhere classified: Secondary | ICD-10-CM | POA: Diagnosis not present

## 2019-02-03 DIAGNOSIS — M25611 Stiffness of right shoulder, not elsewhere classified: Secondary | ICD-10-CM | POA: Diagnosis not present

## 2019-02-03 DIAGNOSIS — M25511 Pain in right shoulder: Secondary | ICD-10-CM | POA: Diagnosis not present

## 2019-02-03 DIAGNOSIS — M6281 Muscle weakness (generalized): Secondary | ICD-10-CM | POA: Diagnosis not present

## 2019-02-03 DIAGNOSIS — M7521 Bicipital tendinitis, right shoulder: Secondary | ICD-10-CM | POA: Diagnosis not present

## 2019-02-09 DIAGNOSIS — M25511 Pain in right shoulder: Secondary | ICD-10-CM | POA: Diagnosis not present

## 2019-02-09 DIAGNOSIS — M25611 Stiffness of right shoulder, not elsewhere classified: Secondary | ICD-10-CM | POA: Diagnosis not present

## 2019-02-09 DIAGNOSIS — M7521 Bicipital tendinitis, right shoulder: Secondary | ICD-10-CM | POA: Diagnosis not present

## 2019-02-09 DIAGNOSIS — M6281 Muscle weakness (generalized): Secondary | ICD-10-CM | POA: Diagnosis not present

## 2019-02-11 DIAGNOSIS — M6281 Muscle weakness (generalized): Secondary | ICD-10-CM | POA: Diagnosis not present

## 2019-02-11 DIAGNOSIS — M25511 Pain in right shoulder: Secondary | ICD-10-CM | POA: Diagnosis not present

## 2019-02-11 DIAGNOSIS — M7521 Bicipital tendinitis, right shoulder: Secondary | ICD-10-CM | POA: Diagnosis not present

## 2019-02-11 DIAGNOSIS — M25611 Stiffness of right shoulder, not elsewhere classified: Secondary | ICD-10-CM | POA: Diagnosis not present

## 2019-02-12 DIAGNOSIS — I82602 Acute embolism and thrombosis of unspecified veins of left upper extremity: Secondary | ICD-10-CM

## 2019-02-12 HISTORY — DX: Acute embolism and thrombosis of unspecified veins of left upper extremity: I82.602

## 2019-02-15 DIAGNOSIS — M6281 Muscle weakness (generalized): Secondary | ICD-10-CM | POA: Diagnosis not present

## 2019-02-15 DIAGNOSIS — M25611 Stiffness of right shoulder, not elsewhere classified: Secondary | ICD-10-CM | POA: Diagnosis not present

## 2019-02-15 DIAGNOSIS — M25511 Pain in right shoulder: Secondary | ICD-10-CM | POA: Diagnosis not present

## 2019-02-15 DIAGNOSIS — M7521 Bicipital tendinitis, right shoulder: Secondary | ICD-10-CM | POA: Diagnosis not present

## 2019-02-18 DIAGNOSIS — M6281 Muscle weakness (generalized): Secondary | ICD-10-CM | POA: Diagnosis not present

## 2019-02-18 DIAGNOSIS — M7521 Bicipital tendinitis, right shoulder: Secondary | ICD-10-CM | POA: Diagnosis not present

## 2019-02-18 DIAGNOSIS — M25611 Stiffness of right shoulder, not elsewhere classified: Secondary | ICD-10-CM | POA: Diagnosis not present

## 2019-02-18 DIAGNOSIS — M25511 Pain in right shoulder: Secondary | ICD-10-CM | POA: Diagnosis not present

## 2019-02-22 DIAGNOSIS — M25611 Stiffness of right shoulder, not elsewhere classified: Secondary | ICD-10-CM | POA: Diagnosis not present

## 2019-02-22 DIAGNOSIS — M7521 Bicipital tendinitis, right shoulder: Secondary | ICD-10-CM | POA: Diagnosis not present

## 2019-02-22 DIAGNOSIS — M6281 Muscle weakness (generalized): Secondary | ICD-10-CM | POA: Diagnosis not present

## 2019-02-22 DIAGNOSIS — M25511 Pain in right shoulder: Secondary | ICD-10-CM | POA: Diagnosis not present

## 2019-02-25 DIAGNOSIS — M25611 Stiffness of right shoulder, not elsewhere classified: Secondary | ICD-10-CM | POA: Diagnosis not present

## 2019-02-25 DIAGNOSIS — M6281 Muscle weakness (generalized): Secondary | ICD-10-CM | POA: Diagnosis not present

## 2019-02-25 DIAGNOSIS — M7521 Bicipital tendinitis, right shoulder: Secondary | ICD-10-CM | POA: Diagnosis not present

## 2019-02-25 DIAGNOSIS — M25511 Pain in right shoulder: Secondary | ICD-10-CM | POA: Diagnosis not present

## 2019-03-01 DIAGNOSIS — M25511 Pain in right shoulder: Secondary | ICD-10-CM | POA: Diagnosis not present

## 2019-03-01 DIAGNOSIS — M25611 Stiffness of right shoulder, not elsewhere classified: Secondary | ICD-10-CM | POA: Diagnosis not present

## 2019-03-01 DIAGNOSIS — M7521 Bicipital tendinitis, right shoulder: Secondary | ICD-10-CM | POA: Diagnosis not present

## 2019-03-01 DIAGNOSIS — M6281 Muscle weakness (generalized): Secondary | ICD-10-CM | POA: Diagnosis not present

## 2019-03-04 DIAGNOSIS — M7521 Bicipital tendinitis, right shoulder: Secondary | ICD-10-CM | POA: Diagnosis not present

## 2019-03-04 DIAGNOSIS — M6281 Muscle weakness (generalized): Secondary | ICD-10-CM | POA: Diagnosis not present

## 2019-03-04 DIAGNOSIS — M25611 Stiffness of right shoulder, not elsewhere classified: Secondary | ICD-10-CM | POA: Diagnosis not present

## 2019-03-04 DIAGNOSIS — M25511 Pain in right shoulder: Secondary | ICD-10-CM | POA: Diagnosis not present

## 2019-03-12 DIAGNOSIS — E611 Iron deficiency: Secondary | ICD-10-CM | POA: Diagnosis not present

## 2019-03-12 DIAGNOSIS — M791 Myalgia, unspecified site: Secondary | ICD-10-CM | POA: Diagnosis not present

## 2019-03-12 DIAGNOSIS — K900 Celiac disease: Secondary | ICD-10-CM | POA: Diagnosis not present

## 2019-03-12 DIAGNOSIS — R5383 Other fatigue: Secondary | ICD-10-CM | POA: Diagnosis not present

## 2019-03-12 DIAGNOSIS — Z1331 Encounter for screening for depression: Secondary | ICD-10-CM | POA: Diagnosis not present

## 2019-03-12 DIAGNOSIS — M2550 Pain in unspecified joint: Secondary | ICD-10-CM | POA: Diagnosis not present

## 2019-03-16 DIAGNOSIS — M79601 Pain in right arm: Secondary | ICD-10-CM | POA: Diagnosis not present

## 2019-03-16 DIAGNOSIS — M25511 Pain in right shoulder: Secondary | ICD-10-CM | POA: Diagnosis not present

## 2019-03-19 DIAGNOSIS — M79601 Pain in right arm: Secondary | ICD-10-CM | POA: Diagnosis not present

## 2019-03-19 DIAGNOSIS — M25511 Pain in right shoulder: Secondary | ICD-10-CM | POA: Diagnosis not present

## 2019-03-22 DIAGNOSIS — M25511 Pain in right shoulder: Secondary | ICD-10-CM | POA: Diagnosis not present

## 2019-03-22 DIAGNOSIS — M79601 Pain in right arm: Secondary | ICD-10-CM | POA: Diagnosis not present

## 2019-03-24 DIAGNOSIS — M25511 Pain in right shoulder: Secondary | ICD-10-CM | POA: Diagnosis not present

## 2019-03-24 DIAGNOSIS — M79601 Pain in right arm: Secondary | ICD-10-CM | POA: Diagnosis not present

## 2019-03-31 DIAGNOSIS — M25511 Pain in right shoulder: Secondary | ICD-10-CM | POA: Diagnosis not present

## 2019-03-31 DIAGNOSIS — S43432A Superior glenoid labrum lesion of left shoulder, initial encounter: Secondary | ICD-10-CM | POA: Diagnosis not present

## 2019-03-31 DIAGNOSIS — M79601 Pain in right arm: Secondary | ICD-10-CM | POA: Diagnosis not present

## 2019-04-02 DIAGNOSIS — M79601 Pain in right arm: Secondary | ICD-10-CM | POA: Diagnosis not present

## 2019-04-02 DIAGNOSIS — M25511 Pain in right shoulder: Secondary | ICD-10-CM | POA: Diagnosis not present

## 2019-04-07 DIAGNOSIS — M25511 Pain in right shoulder: Secondary | ICD-10-CM | POA: Diagnosis not present

## 2019-04-07 DIAGNOSIS — M79601 Pain in right arm: Secondary | ICD-10-CM | POA: Diagnosis not present

## 2019-04-07 DIAGNOSIS — M25512 Pain in left shoulder: Secondary | ICD-10-CM | POA: Diagnosis not present

## 2019-04-09 DIAGNOSIS — M25511 Pain in right shoulder: Secondary | ICD-10-CM | POA: Diagnosis not present

## 2019-04-09 DIAGNOSIS — M79601 Pain in right arm: Secondary | ICD-10-CM | POA: Diagnosis not present

## 2019-04-13 DIAGNOSIS — M79601 Pain in right arm: Secondary | ICD-10-CM | POA: Diagnosis not present

## 2019-04-13 DIAGNOSIS — M25511 Pain in right shoulder: Secondary | ICD-10-CM | POA: Diagnosis not present

## 2019-04-16 DIAGNOSIS — Z20828 Contact with and (suspected) exposure to other viral communicable diseases: Secondary | ICD-10-CM | POA: Diagnosis not present

## 2019-04-20 DIAGNOSIS — M25511 Pain in right shoulder: Secondary | ICD-10-CM | POA: Diagnosis not present

## 2019-04-20 DIAGNOSIS — M79601 Pain in right arm: Secondary | ICD-10-CM | POA: Diagnosis not present

## 2019-04-22 DIAGNOSIS — M79601 Pain in right arm: Secondary | ICD-10-CM | POA: Diagnosis not present

## 2019-04-22 DIAGNOSIS — M25511 Pain in right shoulder: Secondary | ICD-10-CM | POA: Diagnosis not present

## 2019-04-23 DIAGNOSIS — R5383 Other fatigue: Secondary | ICD-10-CM | POA: Diagnosis not present

## 2019-04-23 DIAGNOSIS — Z0000 Encounter for general adult medical examination without abnormal findings: Secondary | ICD-10-CM | POA: Diagnosis not present

## 2019-04-23 DIAGNOSIS — E611 Iron deficiency: Secondary | ICD-10-CM | POA: Diagnosis not present

## 2019-04-26 DIAGNOSIS — E611 Iron deficiency: Secondary | ICD-10-CM | POA: Diagnosis not present

## 2019-04-26 DIAGNOSIS — E7849 Other hyperlipidemia: Secondary | ICD-10-CM | POA: Diagnosis not present

## 2019-04-26 DIAGNOSIS — M25512 Pain in left shoulder: Secondary | ICD-10-CM | POA: Diagnosis not present

## 2019-04-28 DIAGNOSIS — M25511 Pain in right shoulder: Secondary | ICD-10-CM | POA: Diagnosis not present

## 2019-04-28 DIAGNOSIS — M79601 Pain in right arm: Secondary | ICD-10-CM | POA: Diagnosis not present

## 2019-04-29 DIAGNOSIS — E7849 Other hyperlipidemia: Secondary | ICD-10-CM | POA: Diagnosis not present

## 2019-04-29 DIAGNOSIS — N924 Excessive bleeding in the premenopausal period: Secondary | ICD-10-CM | POA: Diagnosis not present

## 2019-04-29 DIAGNOSIS — M25511 Pain in right shoulder: Secondary | ICD-10-CM | POA: Diagnosis not present

## 2019-04-29 DIAGNOSIS — E785 Hyperlipidemia, unspecified: Secondary | ICD-10-CM | POA: Diagnosis not present

## 2019-04-29 DIAGNOSIS — Z0000 Encounter for general adult medical examination without abnormal findings: Secondary | ICD-10-CM | POA: Diagnosis not present

## 2019-04-29 DIAGNOSIS — Z1331 Encounter for screening for depression: Secondary | ICD-10-CM | POA: Diagnosis not present

## 2019-04-29 DIAGNOSIS — K810 Acute cholecystitis: Secondary | ICD-10-CM | POA: Diagnosis not present

## 2019-04-29 DIAGNOSIS — E611 Iron deficiency: Secondary | ICD-10-CM | POA: Diagnosis not present

## 2019-04-30 DIAGNOSIS — M25511 Pain in right shoulder: Secondary | ICD-10-CM | POA: Diagnosis not present

## 2019-04-30 DIAGNOSIS — M79601 Pain in right arm: Secondary | ICD-10-CM | POA: Diagnosis not present

## 2019-05-03 ENCOUNTER — Ambulatory Visit: Payer: BC Managed Care – PPO | Attending: Internal Medicine

## 2019-05-03 DIAGNOSIS — Z20822 Contact with and (suspected) exposure to covid-19: Secondary | ICD-10-CM

## 2019-05-03 DIAGNOSIS — M25511 Pain in right shoulder: Secondary | ICD-10-CM | POA: Diagnosis not present

## 2019-05-03 DIAGNOSIS — M79601 Pain in right arm: Secondary | ICD-10-CM | POA: Diagnosis not present

## 2019-05-03 DIAGNOSIS — R82998 Other abnormal findings in urine: Secondary | ICD-10-CM | POA: Diagnosis not present

## 2019-05-04 LAB — NOVEL CORONAVIRUS, NAA: SARS-CoV-2, NAA: NOT DETECTED

## 2019-05-04 LAB — SARS-COV-2, NAA 2 DAY TAT

## 2019-05-07 DIAGNOSIS — M79601 Pain in right arm: Secondary | ICD-10-CM | POA: Diagnosis not present

## 2019-05-07 DIAGNOSIS — Z1212 Encounter for screening for malignant neoplasm of rectum: Secondary | ICD-10-CM | POA: Diagnosis not present

## 2019-05-07 DIAGNOSIS — M25511 Pain in right shoulder: Secondary | ICD-10-CM | POA: Diagnosis not present

## 2019-05-27 DIAGNOSIS — M25512 Pain in left shoulder: Secondary | ICD-10-CM | POA: Diagnosis not present

## 2019-05-31 DIAGNOSIS — M25511 Pain in right shoulder: Secondary | ICD-10-CM | POA: Diagnosis not present

## 2019-05-31 DIAGNOSIS — M79601 Pain in right arm: Secondary | ICD-10-CM | POA: Diagnosis not present

## 2019-06-14 DIAGNOSIS — M79601 Pain in right arm: Secondary | ICD-10-CM | POA: Diagnosis not present

## 2019-06-14 DIAGNOSIS — M25511 Pain in right shoulder: Secondary | ICD-10-CM | POA: Diagnosis not present

## 2019-06-24 DIAGNOSIS — S43432D Superior glenoid labrum lesion of left shoulder, subsequent encounter: Secondary | ICD-10-CM | POA: Diagnosis not present

## 2019-07-01 DIAGNOSIS — Z1231 Encounter for screening mammogram for malignant neoplasm of breast: Secondary | ICD-10-CM | POA: Diagnosis not present

## 2019-07-01 DIAGNOSIS — Z6823 Body mass index (BMI) 23.0-23.9, adult: Secondary | ICD-10-CM | POA: Diagnosis not present

## 2019-07-01 DIAGNOSIS — Z01419 Encounter for gynecological examination (general) (routine) without abnormal findings: Secondary | ICD-10-CM | POA: Diagnosis not present

## 2019-07-07 DIAGNOSIS — M25511 Pain in right shoulder: Secondary | ICD-10-CM | POA: Diagnosis not present

## 2019-07-07 DIAGNOSIS — M79601 Pain in right arm: Secondary | ICD-10-CM | POA: Diagnosis not present

## 2019-07-19 DIAGNOSIS — M545 Low back pain: Secondary | ICD-10-CM | POA: Diagnosis not present

## 2019-08-04 DIAGNOSIS — M79601 Pain in right arm: Secondary | ICD-10-CM | POA: Diagnosis not present

## 2019-08-04 DIAGNOSIS — M25551 Pain in right hip: Secondary | ICD-10-CM | POA: Diagnosis not present

## 2019-08-04 DIAGNOSIS — M25511 Pain in right shoulder: Secondary | ICD-10-CM | POA: Diagnosis not present

## 2019-08-04 DIAGNOSIS — M545 Low back pain: Secondary | ICD-10-CM | POA: Diagnosis not present

## 2019-09-16 DIAGNOSIS — G43519 Persistent migraine aura without cerebral infarction, intractable, without status migrainosus: Secondary | ICD-10-CM | POA: Diagnosis not present

## 2019-09-17 ENCOUNTER — Other Ambulatory Visit: Payer: Self-pay | Admitting: Internal Medicine

## 2019-09-17 DIAGNOSIS — G43519 Persistent migraine aura without cerebral infarction, intractable, without status migrainosus: Secondary | ICD-10-CM

## 2019-09-18 ENCOUNTER — Ambulatory Visit
Admission: RE | Admit: 2019-09-18 | Discharge: 2019-09-18 | Disposition: A | Discharge status: Home / Self Care | Payer: BC Managed Care – PPO | Source: Ambulatory Visit | Attending: Internal Medicine | Admitting: Internal Medicine

## 2019-09-18 ENCOUNTER — Other Ambulatory Visit: Payer: Self-pay

## 2019-09-18 DIAGNOSIS — G43519 Persistent migraine aura without cerebral infarction, intractable, without status migrainosus: Secondary | ICD-10-CM

## 2019-09-18 DIAGNOSIS — R29810 Facial weakness: Secondary | ICD-10-CM | POA: Diagnosis not present

## 2019-09-18 DIAGNOSIS — E162 Hypoglycemia, unspecified: Secondary | ICD-10-CM | POA: Diagnosis not present

## 2019-09-18 DIAGNOSIS — R202 Paresthesia of skin: Secondary | ICD-10-CM | POA: Diagnosis not present

## 2019-09-18 DIAGNOSIS — G43909 Migraine, unspecified, not intractable, without status migrainosus: Secondary | ICD-10-CM | POA: Diagnosis not present

## 2019-09-18 DIAGNOSIS — E161 Other hypoglycemia: Secondary | ICD-10-CM | POA: Diagnosis not present

## 2019-09-18 MED ORDER — GADOBENATE DIMEGLUMINE 529 MG/ML IV SOLN
11.0000 mL | Freq: Once | INTRAVENOUS | Status: AC | PRN
Start: 2019-09-18 — End: 2019-09-18
  Administered 2019-09-18: 11 mL via INTRAVENOUS

## 2019-09-20 DIAGNOSIS — M25511 Pain in right shoulder: Secondary | ICD-10-CM | POA: Diagnosis not present

## 2019-09-20 DIAGNOSIS — M79601 Pain in right arm: Secondary | ICD-10-CM | POA: Diagnosis not present

## 2019-10-04 DIAGNOSIS — M25511 Pain in right shoulder: Secondary | ICD-10-CM | POA: Diagnosis not present

## 2019-10-04 DIAGNOSIS — M79601 Pain in right arm: Secondary | ICD-10-CM | POA: Diagnosis not present

## 2019-10-20 DIAGNOSIS — M79601 Pain in right arm: Secondary | ICD-10-CM | POA: Diagnosis not present

## 2019-10-20 DIAGNOSIS — M25511 Pain in right shoulder: Secondary | ICD-10-CM | POA: Diagnosis not present

## 2019-10-31 ENCOUNTER — Encounter (HOSPITAL_BASED_OUTPATIENT_CLINIC_OR_DEPARTMENT_OTHER): Payer: Self-pay | Admitting: Emergency Medicine

## 2019-10-31 ENCOUNTER — Emergency Department (HOSPITAL_BASED_OUTPATIENT_CLINIC_OR_DEPARTMENT_OTHER): Payer: BC Managed Care – PPO

## 2019-10-31 ENCOUNTER — Other Ambulatory Visit: Payer: Self-pay

## 2019-10-31 ENCOUNTER — Emergency Department (HOSPITAL_BASED_OUTPATIENT_CLINIC_OR_DEPARTMENT_OTHER)
Admission: EM | Admit: 2019-10-31 | Discharge: 2019-10-31 | Disposition: A | Discharge status: Home / Self Care | Payer: BC Managed Care – PPO | Attending: Emergency Medicine | Admitting: Emergency Medicine

## 2019-10-31 DIAGNOSIS — Z9882 Breast implant status: Secondary | ICD-10-CM | POA: Diagnosis not present

## 2019-10-31 DIAGNOSIS — I82622 Acute embolism and thrombosis of deep veins of left upper extremity: Secondary | ICD-10-CM | POA: Diagnosis not present

## 2019-10-31 DIAGNOSIS — M79602 Pain in left arm: Secondary | ICD-10-CM | POA: Diagnosis not present

## 2019-10-31 DIAGNOSIS — M7989 Other specified soft tissue disorders: Secondary | ICD-10-CM | POA: Diagnosis not present

## 2019-10-31 DIAGNOSIS — M26629 Arthralgia of temporomandibular joint, unspecified side: Secondary | ICD-10-CM | POA: Diagnosis not present

## 2019-10-31 DIAGNOSIS — I82612 Acute embolism and thrombosis of superficial veins of left upper extremity: Secondary | ICD-10-CM | POA: Diagnosis not present

## 2019-10-31 DIAGNOSIS — M79622 Pain in left upper arm: Secondary | ICD-10-CM | POA: Diagnosis not present

## 2019-10-31 DIAGNOSIS — M25522 Pain in left elbow: Secondary | ICD-10-CM | POA: Diagnosis not present

## 2019-10-31 HISTORY — DX: Celiac disease: K90.0

## 2019-10-31 LAB — CBC WITH DIFFERENTIAL/PLATELET
Abs Immature Granulocytes: 0.02 10*3/uL (ref 0.00–0.07)
Basophils Absolute: 0.1 10*3/uL (ref 0.0–0.1)
Basophils Relative: 1 %
Eosinophils Absolute: 0.5 10*3/uL (ref 0.0–0.5)
Eosinophils Relative: 5 %
HCT: 44.8 % (ref 36.0–46.0)
Hemoglobin: 15 g/dL (ref 12.0–15.0)
Immature Granulocytes: 0 %
Lymphocytes Relative: 18 %
Lymphs Abs: 1.6 10*3/uL (ref 0.7–4.0)
MCH: 31.1 pg (ref 26.0–34.0)
MCHC: 33.5 g/dL (ref 30.0–36.0)
MCV: 92.8 fL (ref 80.0–100.0)
Monocytes Absolute: 0.6 10*3/uL (ref 0.1–1.0)
Monocytes Relative: 7 %
Neutro Abs: 6.3 10*3/uL (ref 1.7–7.7)
Neutrophils Relative %: 69 %
Platelets: 378 10*3/uL (ref 150–400)
RBC: 4.83 MIL/uL (ref 3.87–5.11)
RDW: 12.6 % (ref 11.5–15.5)
WBC: 9.1 10*3/uL (ref 4.0–10.5)
nRBC: 0 % (ref 0.0–0.2)

## 2019-10-31 LAB — BASIC METABOLIC PANEL
Anion gap: 10 (ref 5–15)
BUN: 16 mg/dL (ref 6–20)
CO2: 28 mmol/L (ref 22–32)
Calcium: 9.5 mg/dL (ref 8.9–10.3)
Chloride: 98 mmol/L (ref 98–111)
Creatinine, Ser: 0.66 mg/dL (ref 0.44–1.00)
GFR calc Af Amer: >60 mL/min (ref >60)
GFR calc non Af Amer: >60 mL/min (ref >60)
Glucose, Bld: 91 mg/dL (ref 70–99)
Potassium: 4 mmol/L (ref 3.5–5.1)
Sodium: 136 mmol/L (ref 135–145)

## 2019-10-31 LAB — CK: Total CK: 58 U/L (ref 38–234)

## 2019-10-31 MED ORDER — RIVAROXABAN 15 MG PO TABS: 15.0000 mg | ORAL_TABLET | Freq: Once | ORAL | Status: AC

## 2019-10-31 MED ORDER — RIVAROXABAN 15 MG PO TABS
15.0000 mg | ORAL_TABLET | Freq: Once | ORAL | Status: AC
  Administered 2019-10-31: 15 mg via ORAL
  Filled 2019-10-31: qty 1

## 2019-10-31 MED ORDER — ACETAMINOPHEN 325 MG PO TABS
650.0000 mg | ORAL_TABLET | Freq: Once | ORAL | Status: AC
  Administered 2019-10-31: 650 mg via ORAL
  Filled 2019-10-31: qty 2

## 2019-10-31 MED ORDER — RIVAROXABAN (XARELTO) VTE STARTER PACK (15 & 20 MG): ORAL_TABLET | ORAL | 0 refills | Status: DC

## 2019-10-31 MED ORDER — SODIUM CHLORIDE 0.9 % IV BOLUS
500.0000 mL | Freq: Once | INTRAVENOUS | Status: AC
  Administered 2019-10-31: 500 mL via INTRAVENOUS

## 2019-10-31 MED ORDER — RIVAROXABAN 15 MG PO TABS
ORAL_TABLET | ORAL | Status: AC
  Administered 2019-10-31: 15 mg via ORAL
  Filled 2019-10-31: qty 1

## 2019-10-31 MED ORDER — FENTANYL CITRATE (PF) 100 MCG/2ML IJ SOLN
50.0000 ug | Freq: Once | INTRAMUSCULAR | Status: DC
  Filled 2019-10-31: qty 2

## 2019-10-31 NOTE — ED Provider Notes (Signed)
[facility] EMERGENCY DEPARTMENT Provider Note   CSN: [id number] Arrival date & time: 10/31/19  1056     History Chief Complaint  Patient presents with  . Arm Pain    Denise Arellano is a 49 y.o. female.  The history is provided by the patient.  Illness Location:  Left arm Quality:  Pain Severity:  Mild Onset quality:  Gradual Duration:  4 days Timing:  Constant Progression:  Unchanged Chronicity:  New Context:  Left tricep pain for 4 days. Patient with surgery to left breast last week, pain two days ago. Pain focally in left tricep area. No numbness or weakness or tingling. Patient sent for DVT study. Relieved by:  Nothing Worsened by:  Nothing Associated symptoms: no abdominal pain, no chest pain, no congestion, no cough, no diarrhea, no ear pain, no fatigue, no fever, no headaches, no loss of consciousness, no myalgias, no nausea, no rash, no rhinorrhea, no shortness of breath, no sore throat, no vomiting and no wheezing        Past Medical History:  Diagnosis Date  . Celiac disease     Patient Active Problem List   Diagnosis Date Noted  . 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 ANEMIA, IRON DEFICIENCY 05/23/2009  . RECTAL BLEEDING 05/23/2009    Past Surgical History:  Procedure Laterality Date  . BREAST SURGERY     implant removal 10/25/2019  . PLACEMENT OF BREAST IMPLANTS  2007  . SHOULDER SURGERY Right 2020     OB History   No obstetric history on file.     No family history on file.  Social History   Tobacco Use  . Smoking status: Never Smoker  . Smokeless tobacco: Never Used  Vaping Use  . Vaping Use: Never used  Substance Use Topics  . Alcohol use: Not Currently  . Drug use: Never    Home Medications Prior to Admission medications   Medication Sig Start Date End Date Taking? Authorizing Provider  citalopram (CELEXA) 20 MG tablet Take 30 mg by mouth daily.   Yes [provider]  RIVAROXABAN Alveda Reasons) VTE STARTER PACK (15 & 20  MG TABLETS) Follow package directions: Take one 14m tablet by mouth twice a day. On day 22, switch to one 244mtablet once a day. Take with food. 10/31/19   Denise Willadsen, DO    Allergies    Aspirin and Ibuprofen  Review of Systems   Review of Systems  Constitutional: Negative for chills, fatigue and fever.  HENT: Negative for congestion, ear pain, rhinorrhea and sore throat.   Eyes: Negative for pain and visual disturbance.  Respiratory: Negative for cough, shortness of breath and wheezing.   Cardiovascular: Negative for chest pain and palpitations.  Gastrointestinal: Negative for abdominal pain, diarrhea, nausea and vomiting.  Genitourinary: Negative for dysuria and hematuria.  Musculoskeletal: Positive for arthralgias. Negative for back pain and myalgias.  Skin: Negative for color change and rash.  Neurological: Negative for seizures, loss of consciousness, syncope and headaches.  All other systems reviewed and are negative.   Physical Exam Updated Vital Signs  ED Triage Vitals  Enc Vitals Group     BP 10/31/19 1106 127/90     Pulse Rate 10/31/19 1106 75     Resp 10/31/19 1106 15     Temp 10/31/19 1106 98.1 F (36.7 C)     Temp Source 10/31/19 1106 Oral     SpO2 10/31/19 1106 98 %     Weight 10/31/19 1110 120 lb (  54.4 kg)     Height 10/31/19 1110 5' 1"  (1.549 m)     Head Circumference --      Peak Flow --      Pain Score 10/31/19 1110 8     Pain Loc --      Pain Edu? --      Excl. in [place]? --      Physical Exam Vitals and nursing note reviewed.  Constitutional:      General: She is not in acute distress.    Appearance: She is well-developed. She is not ill-appearing.  HENT:     Head: Normocephalic and atraumatic.     Nose: Nose normal.     Mouth/Throat:     Mouth: Mucous membranes are moist.  Eyes:     Extraocular Movements: Extraocular movements intact.     Conjunctiva/sclera: Conjunctivae normal.     Pupils: Pupils are equal, round, and reactive to light.    Cardiovascular:     Rate and Rhythm: Normal rate and regular rhythm.     Pulses: Normal pulses.     Heart sounds: Normal heart sounds. No murmur heard.   Pulmonary:     Effort: Pulmonary effort is normal. No respiratory distress.     Breath sounds: Normal breath sounds.  Abdominal:     Palpations: Abdomen is soft.     Tenderness: There is no abdominal tenderness.  Musculoskeletal:        General: Tenderness present.     Cervical back: Normal range of motion and neck supple.     Comments: Tenderness focally to left tricep area, there is no swelling or redness to the left upper arm  Skin:    General: Skin is warm and dry.     Capillary Refill: Capillary refill takes less than 2 seconds.     Comments: Left-sided surgical chest wall site is clear dry and intact.  Serosanguineous drainage from surgical tube  Neurological:     General: No focal deficit present.     Mental Status: She is alert.     Sensory: No sensory deficit.     Motor: No weakness.     Comments: 5+ out of 5 strength throughout, normal sensation throughout  Psychiatric:        Mood and Affect: Mood normal.     ED Results / Procedures / Treatments   Labs (all labs ordered are listed, but only abnormal results are displayed) Labs Reviewed  CBC WITH DIFFERENTIAL/PLATELET  BASIC METABOLIC PANEL  CK    EKG None  Radiology US Venous Img Upper Left (DVT Study)  Result Date: 10/31/2019 CLINICAL DATA:  Left medial elbow and upper arm pain 3 days. Left breast implant removal 10/26/2019. EXAM: LEFT UPPER EXTREMITY VENOUS DOPPLER ULTRASOUND TECHNIQUE: Gray-scale sonography with graded compression, as well as color Doppler and duplex ultrasound were performed to evaluate the upper extremity deep venous system from the level of the subclavian vein and including the jugular, axillary, basilic, radial, ulnar and upper cephalic vein. Spectral Doppler was utilized to evaluate flow at rest and with distal augmentation  maneuvers. COMPARISON:  None. FINDINGS: Contralateral Subclavian Vein: Respiratory phasicity is normal and symmetric with the symptomatic side. No evidence of thrombus. Normal compressibility. Internal Jugular Vein: No evidence of thrombus. Normal compressibility, respiratory phasicity and response to augmentation. Subclavian Vein: No evidence of thrombus. Normal compressibility, respiratory phasicity and response to augmentation. Axillary Vein: No evidence of thrombus. Normal compressibility, respiratory phasicity and response to augmentation. Cephalic Vein: No  evidence of thrombus. Normal compressibility, respiratory phasicity and response to augmentation. Basilic Vein: Occlusive thrombus present with noncompressibility within the basilic vein from the mid upper arm to the elbow. Brachial Veins: Occlusive thrombus with noncompressibility over the brachial vein at the level of the proximal humerus which is patent at the level of the distal humerus. Radial Veins: No evidence of thrombus. Normal compressibility, respiratory phasicity and response to augmentation. Ulnar Veins: No evidence of thrombus. Normal compressibility, respiratory phasicity and response to augmentation. Venous Reflux:  None visualized. Other Findings:  None visualized. IMPRESSION: 1. Occlusive thrombus within the basilic vein extending from the mid upper arm to the elbow. 2. Occlusive thrombus over the brachial vein at the level of the proximal humerus. These results were called by telephone at the time of interpretation on 10/31/2019 at 12:24 pm to provider Yuliet Needs , who verbally acknowledged these results. Electronically Signed   By: Marin Olp M.D.   On: 10/31/2019 12:22   DG Humerus Left  Result Date: 10/31/2019 CLINICAL DATA:  Pain and swelling adjacent distal left humerus 4 days. Breast implant removal surgery last week. EXAM: LEFT HUMERUS - 2+ VIEW COMPARISON:  None. FINDINGS: There is no evidence of fracture or other focal  bone lesions. Soft tissues are unremarkable. IMPRESSION: Negative. Electronically Signed   By: Marin Olp M.D.   On: 10/31/2019 11:41    Procedures Procedures (including critical care time)  Medications Ordered in ED Medications  fentaNYL (SUBLIMAZE) injection 50 mcg (50 mcg Intravenous Not Given 10/31/19 1225)  sodium chloride 0.9 % bolus 500 mL (0 mLs Intravenous Stopped 10/31/19 1305)  acetaminophen (TYLENOL) tablet 650 mg (650 mg Oral Given 10/31/19 1304)  Rivaroxaban (XARELTO) tablet 15 mg (15 mg Oral Given 10/31/19 1420)    ED Course  I have reviewed the triage vital signs and the nursing notes.  Pertinent labs & imaging results that were available during my care of the patient were reviewed by me and considered in my medical decision making (see chart for details).    MDM Rules/Calculators/A&P                          Denise Arellano is a 49 year old female who presents to the ED with left arm pain.  Normal vitals.  No fever.  Sent for DVT study from her physician.  Had recent surgery to her left breast last week.  Has had left arm pain for the last several days since then.  Focally in her left triceps area.  Does not appear to extend into the shoulder or the lower arm.  She has normal strength and sensation.  Normal pulses.  No cellulitic changes, no obvious abscess or fluctuance.  Could be a neuromuscular type pain.  However will get a DVT study.  We will get basic labs including a CK.  Lab work unremarkable.  Ultrasound positive for occlusive thrombus in the basilic vein as well as occlusive thrombus in the brachial vein.  Talked with Dr. Donzetta Matters with vascular surgery who recommends anticoagulation.  Talked with her surgeon who did recent breast implant removal and he was okay with starting anticoagulation as well.  Patient to follow-up with primary care doctor and started on Xarelto.  Discharged in good condition.  This chart was dictated using voice recognition software.  Despite  best efforts to proofread,  errors can occur which can change the documentation meaning.    Final Clinical Impression(s) / ED Diagnoses Final  diagnoses:  Acute embolism and thrombosis of deep vein of left upper extremity ([place])    Rx / DC Orders ED Discharge Orders         Ordered    RIVAROXABAN (XARELTO) VTE STARTER PACK (15 & 20 MG TABLETS)        10/31/19 Rosa Sanchez, [place], DO 10/31/19 1519

## 2019-10-31 NOTE — Discharge Instructions (Addendum)
[facility] tomorrow morning to pick up Xarelto starter pack from pharmacy of education about medication.  Please follow-up with your primary care doctor.  Please return to the emergency department if symptoms worsen as discussed.

## 2019-10-31 NOTE — ED Notes (Signed)
Vascular surgery called by [facility]

## 2019-10-31 NOTE — ED Triage Notes (Signed)
Pt c/o pain to left bicep area onset Thursday. Pt had breast implant removal on Tuesday.

## 2019-11-01 DIAGNOSIS — I82622 Acute embolism and thrombosis of deep veins of left upper extremity: Secondary | ICD-10-CM | POA: Diagnosis not present

## 2019-11-01 MED FILL — XARELTO STARTER PACK: 15 & 20 | 28 days supply | Qty: 51 | Fill #0

## 2020-01-21 ENCOUNTER — Other Ambulatory Visit: Payer: Self-pay

## 2020-01-21 ENCOUNTER — Other Ambulatory Visit (HOSPITAL_BASED_OUTPATIENT_CLINIC_OR_DEPARTMENT_OTHER): Payer: Self-pay | Admitting: Internal Medicine

## 2020-01-21 ENCOUNTER — Ambulatory Visit (HOSPITAL_BASED_OUTPATIENT_CLINIC_OR_DEPARTMENT_OTHER)
Admission: RE | Admit: 2020-01-21 | Discharge: 2020-01-21 | Disposition: A | Discharge status: Home / Self Care | Payer: BC Managed Care – PPO | Source: Ambulatory Visit | Attending: Internal Medicine | Admitting: Internal Medicine

## 2020-01-21 DIAGNOSIS — R1011 Right upper quadrant pain: Secondary | ICD-10-CM | POA: Diagnosis not present

## 2020-01-21 DIAGNOSIS — K7689 Other specified diseases of liver: Secondary | ICD-10-CM | POA: Diagnosis not present

## 2020-01-25 ENCOUNTER — Ambulatory Visit
Admission: RE | Admit: 2020-01-25 | Discharge: 2020-01-25 | Disposition: A | Discharge status: Home / Self Care | Payer: BC Managed Care – PPO | Source: Ambulatory Visit | Attending: Internal Medicine | Admitting: Internal Medicine

## 2020-01-25 ENCOUNTER — Other Ambulatory Visit: Payer: Self-pay

## 2020-01-25 ENCOUNTER — Other Ambulatory Visit: Payer: Self-pay | Admitting: Internal Medicine

## 2020-01-25 DIAGNOSIS — R140 Abdominal distension (gaseous): Secondary | ICD-10-CM | POA: Diagnosis not present

## 2020-01-25 DIAGNOSIS — K7689 Other specified diseases of liver: Secondary | ICD-10-CM | POA: Diagnosis not present

## 2020-01-25 DIAGNOSIS — R1031 Right lower quadrant pain: Secondary | ICD-10-CM | POA: Diagnosis not present

## 2020-01-25 DIAGNOSIS — R102 Pelvic and perineal pain: Secondary | ICD-10-CM | POA: Diagnosis not present

## 2020-01-25 DIAGNOSIS — R109 Unspecified abdominal pain: Secondary | ICD-10-CM

## 2020-01-25 MED ORDER — IOPAMIDOL (ISOVUE-300) INJECTION 61%
100.0000 mL | Freq: Once | INTRAVENOUS | Status: AC | PRN
  Administered 2020-01-25: 16:00:00 100 mL via INTRAVENOUS

## 2020-01-27 ENCOUNTER — Other Ambulatory Visit (HOSPITAL_BASED_OUTPATIENT_CLINIC_OR_DEPARTMENT_OTHER): Payer: Self-pay | Admitting: Internal Medicine

## 2020-01-27 DIAGNOSIS — K769 Liver disease, unspecified: Secondary | ICD-10-CM

## 2020-02-06 ENCOUNTER — Ambulatory Visit (HOSPITAL_BASED_OUTPATIENT_CLINIC_OR_DEPARTMENT_OTHER)
Admission: RE | Admit: 2020-02-06 | Discharge: 2020-02-06 | Disposition: A | Discharge status: Home / Self Care | Payer: BC Managed Care – PPO | Source: Ambulatory Visit | Attending: Internal Medicine | Admitting: Internal Medicine

## 2020-02-06 ENCOUNTER — Other Ambulatory Visit: Payer: Self-pay

## 2020-02-06 DIAGNOSIS — K769 Liver disease, unspecified: Secondary | ICD-10-CM | POA: Diagnosis not present

## 2020-02-06 DIAGNOSIS — D1803 Hemangioma of intra-abdominal structures: Secondary | ICD-10-CM | POA: Diagnosis not present

## 2020-02-06 DIAGNOSIS — I700 Atherosclerosis of aorta: Secondary | ICD-10-CM | POA: Diagnosis not present

## 2020-02-06 DIAGNOSIS — K7689 Other specified diseases of liver: Secondary | ICD-10-CM | POA: Diagnosis not present

## 2020-02-06 MED ORDER — GADOBUTROL 1 MMOL/ML IV SOLN
5.0000 mL | Freq: Once | INTRAVENOUS | Status: AC | PRN
  Administered 2020-02-06: 5 mL via INTRAVENOUS

## 2020-02-29 ENCOUNTER — Ambulatory Visit (HOSPITAL_BASED_OUTPATIENT_CLINIC_OR_DEPARTMENT_OTHER)
Admission: RE | Admit: 2020-02-29 | Discharge: 2020-02-29 | Disposition: A | Discharge status: Home / Self Care | Payer: 59 | Source: Ambulatory Visit | Attending: Internal Medicine | Admitting: Internal Medicine

## 2020-02-29 ENCOUNTER — Other Ambulatory Visit (HOSPITAL_BASED_OUTPATIENT_CLINIC_OR_DEPARTMENT_OTHER): Payer: Self-pay | Admitting: Internal Medicine

## 2020-02-29 ENCOUNTER — Other Ambulatory Visit: Payer: Self-pay

## 2020-02-29 DIAGNOSIS — I82622 Acute embolism and thrombosis of deep veins of left upper extremity: Secondary | ICD-10-CM | POA: Insufficient documentation

## 2020-03-16 ENCOUNTER — Other Ambulatory Visit: Payer: Self-pay

## 2020-03-16 ENCOUNTER — Ambulatory Visit (HOSPITAL_COMMUNITY)
Admission: RE | Admit: 2020-03-16 | Discharge: 2020-03-16 | Disposition: A | Discharge status: Home / Self Care | Payer: 59 | Source: Ambulatory Visit | Attending: Internal Medicine | Admitting: Internal Medicine

## 2020-03-16 ENCOUNTER — Other Ambulatory Visit (HOSPITAL_COMMUNITY): Payer: Self-pay | Admitting: Internal Medicine

## 2020-03-16 DIAGNOSIS — I82621 Acute embolism and thrombosis of deep veins of right upper extremity: Secondary | ICD-10-CM

## 2020-04-28 ENCOUNTER — Other Ambulatory Visit: Payer: Self-pay | Admitting: Internal Medicine

## 2020-04-28 DIAGNOSIS — R519 Headache, unspecified: Secondary | ICD-10-CM

## 2020-04-28 DIAGNOSIS — H93A2 Pulsatile tinnitus, left ear: Secondary | ICD-10-CM

## 2020-05-02 ENCOUNTER — Other Ambulatory Visit: Payer: Self-pay

## 2020-05-02 ENCOUNTER — Ambulatory Visit
Admission: RE | Admit: 2020-05-02 | Discharge: 2020-05-02 | Disposition: A | Discharge status: Home / Self Care | Payer: 59 | Source: Ambulatory Visit | Attending: Internal Medicine | Admitting: Internal Medicine

## 2020-05-02 DIAGNOSIS — R519 Headache, unspecified: Secondary | ICD-10-CM

## 2020-05-02 DIAGNOSIS — H93A2 Pulsatile tinnitus, left ear: Secondary | ICD-10-CM

## 2020-05-02 MED ORDER — GADOBENATE DIMEGLUMINE 529 MG/ML IV SOLN
12.0000 mL | Freq: Once | INTRAVENOUS | Status: AC | PRN
  Administered 2020-05-02: 12 mL via INTRAVENOUS

## 2020-05-08 ENCOUNTER — Telehealth (HOSPITAL_COMMUNITY): Payer: Self-pay

## 2020-05-08 NOTE — Telephone Encounter (Signed)
Called to schedule consultation with Dr. Estanislado Pandy. Pt would like to wait until she speaks with Dr. Brigitte Pulse and will call back to schedule. AW

## 2020-05-12 ENCOUNTER — Other Ambulatory Visit (HOSPITAL_COMMUNITY): Payer: Self-pay | Admitting: Neurosurgery

## 2020-07-17 ENCOUNTER — Other Ambulatory Visit: Payer: Self-pay | Admitting: Neurosurgery

## 2020-07-17 DIAGNOSIS — I671 Cerebral aneurysm, nonruptured: Secondary | ICD-10-CM

## 2020-08-01 ENCOUNTER — Inpatient Hospital Stay (HOSPITAL_COMMUNITY): Admission: RE | Admit: 2020-08-01 | Payer: 59 | Source: Ambulatory Visit

## 2020-08-02 ENCOUNTER — Other Ambulatory Visit: Payer: Self-pay | Admitting: Neurosurgery

## 2020-08-25 ENCOUNTER — Ambulatory Visit (HOSPITAL_COMMUNITY)
Admission: RE | Admit: 2020-08-25 | Discharge: 2020-08-25 | Disposition: A | Discharge status: Home / Self Care | Payer: 59 | Source: Ambulatory Visit | Attending: Neurosurgery | Admitting: Neurosurgery

## 2020-08-25 ENCOUNTER — Other Ambulatory Visit: Payer: Self-pay | Admitting: Neurosurgery

## 2020-08-25 ENCOUNTER — Encounter (HOSPITAL_COMMUNITY): Payer: Self-pay

## 2020-08-25 DIAGNOSIS — Z886 Allergy status to analgesic agent status: Secondary | ICD-10-CM | POA: Insufficient documentation

## 2020-08-25 DIAGNOSIS — I671 Cerebral aneurysm, nonruptured: Secondary | ICD-10-CM

## 2020-08-25 DIAGNOSIS — Z7901 Long term (current) use of anticoagulants: Secondary | ICD-10-CM | POA: Diagnosis not present

## 2020-08-25 HISTORY — PX: IR ANGIO INTRA EXTRACRAN SEL INTERNAL CAROTID BILAT MOD SED: IMG5363

## 2020-08-25 HISTORY — PX: IR ANGIO VERTEBRAL SEL VERTEBRAL BILAT MOD SED: IMG5369

## 2020-08-25 LAB — CBC WITH DIFFERENTIAL/PLATELET
Abs Immature Granulocytes: 0.02 10*3/uL (ref 0.00–0.07)
Basophils Absolute: 0.1 10*3/uL (ref 0.0–0.1)
Basophils Relative: 1 %
Eosinophils Absolute: 0.3 10*3/uL (ref 0.0–0.5)
Eosinophils Relative: 4 %
HCT: 42.6 % (ref 36.0–46.0)
Hemoglobin: 14.4 g/dL (ref 12.0–15.0)
Immature Granulocytes: 0 %
Lymphocytes Relative: 29 %
Lymphs Abs: 2.3 10*3/uL (ref 0.7–4.0)
MCH: 31.2 pg (ref 26.0–34.0)
MCHC: 33.8 g/dL (ref 30.0–36.0)
MCV: 92.4 fL (ref 80.0–100.0)
Monocytes Absolute: 0.6 10*3/uL (ref 0.1–1.0)
Monocytes Relative: 8 %
Neutro Abs: 4.5 10*3/uL (ref 1.7–7.7)
Neutrophils Relative %: 58 %
Platelets: 363 10*3/uL (ref 150–400)
RBC: 4.61 MIL/uL (ref 3.87–5.11)
RDW: 13.4 % (ref 11.5–15.5)
WBC: 7.8 10*3/uL (ref 4.0–10.5)
nRBC: 0 % (ref 0.0–0.2)

## 2020-08-25 LAB — PREGNANCY, URINE: Preg Test, Ur: NEGATIVE

## 2020-08-25 LAB — BASIC METABOLIC PANEL
Anion gap: 8 (ref 5–15)
BUN: 18 mg/dL (ref 6–20)
CO2: 24 mmol/L (ref 22–32)
Calcium: 9.5 mg/dL (ref 8.9–10.3)
Chloride: 104 mmol/L (ref 98–111)
Creatinine, Ser: 0.8 mg/dL (ref 0.44–1.00)
GFR, Estimated: >60 mL/min (ref >60)
Glucose, Bld: 93 mg/dL (ref 70–99)
Potassium: 3.8 mmol/L (ref 3.5–5.1)
Sodium: 136 mmol/L (ref 135–145)

## 2020-08-25 LAB — PROTIME-INR
INR: 1 (ref 0.8–1.2)
Prothrombin Time: 13.3 seconds (ref 11.4–15.2)

## 2020-08-25 MED ORDER — ACETAMINOPHEN 325 MG PO TABS
ORAL_TABLET | ORAL | Status: AC
  Filled 2020-08-25: qty 2

## 2020-08-25 MED ORDER — HEPARIN SODIUM (PORCINE) 1000 UNIT/ML IJ SOLN
INTRAMUSCULAR | Status: AC
  Filled 2020-08-25: qty 1

## 2020-08-25 MED ORDER — IOHEXOL 300 MG/ML  SOLN
100.0000 mL | Freq: Once | INTRAMUSCULAR | Status: AC | PRN
  Administered 2020-08-25: 45 mL via INTRA_ARTERIAL

## 2020-08-25 MED ORDER — HYDROCODONE-ACETAMINOPHEN 5-325 MG PO TABS: 1.0000 | ORAL_TABLET | ORAL | Status: DC | PRN

## 2020-08-25 MED ORDER — HEPARIN SODIUM (PORCINE) 1000 UNIT/ML IJ SOLN
INTRAMUSCULAR | Status: AC | PRN
  Administered 2020-08-25: 2000 [IU] via INTRAVENOUS

## 2020-08-25 MED ORDER — MIDAZOLAM HCL 2 MG/2ML IJ SOLN
INTRAMUSCULAR | Status: AC
  Filled 2020-08-25: qty 2

## 2020-08-25 MED ORDER — MIDAZOLAM HCL 2 MG/2ML IJ SOLN
INTRAMUSCULAR | Status: AC | PRN
  Administered 2020-08-25: 1 mg via INTRAVENOUS
  Administered 2020-08-25: 0.5 mg via INTRAVENOUS

## 2020-08-25 MED ORDER — FENTANYL CITRATE (PF) 100 MCG/2ML IJ SOLN
INTRAMUSCULAR | Status: AC
  Filled 2020-08-25: qty 2

## 2020-08-25 MED ORDER — SODIUM CHLORIDE 0.9 % IV SOLN: INTRAVENOUS | Status: DC

## 2020-08-25 MED ORDER — LIDOCAINE HCL 1 % IJ SOLN
INTRAMUSCULAR | Status: AC
  Filled 2020-08-25: qty 20

## 2020-08-25 MED ORDER — FENTANYL CITRATE (PF) 100 MCG/2ML IJ SOLN
INTRAMUSCULAR | Status: AC | PRN
  Administered 2020-08-25 (×2): 25 ug via INTRAVENOUS

## 2020-08-25 MED ORDER — ACETAMINOPHEN 325 MG PO TABS
650.0000 mg | ORAL_TABLET | Freq: Once | ORAL | Status: AC
  Administered 2020-08-25: 650 mg via ORAL

## 2020-08-25 NOTE — H&P (Signed)
  Chief Complaint   Aneurysm  History of Present Illness  Denise Arellano is a 50 y.o. female with hx of migraine headaches who underwent MRA ordered by her primary care physician.  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  Patient therefore presents today for further work-up with diagnostic cerebral angiogram.  Past Medical History   Past Medical History:  Diagnosis Date   Celiac disease     Past Surgical History   Past Surgical History:  Procedure Laterality Date   BREAST SURGERY     implant removal 10/25/2019   PLACEMENT OF BREAST IMPLANTS  2007   SHOULDER SURGERY Right 2020    Social History   Social History   Tobacco Use   Smoking status: Never   Smokeless tobacco: Never  Vaping Use   Vaping Use: Never used  Substance Use Topics   Alcohol use: Not Currently   Drug use: Never    Medications   Prior to Admission medications   Medication Sig Start Date End Date Taking? Authorizing Provider  citalopram (CELEXA) 20 MG tablet Take 30 mg by mouth daily.   Yes [provider]  RIVAROXABAN Alveda Reasons) VTE STARTER PACK (15 & 20 MG TABLETS) Follow package directions: Take one 59m tablet by mouth twice a day. On day 22, switch to one 248mtablet once a day. Take with food. 10/31/19   Curatolo, Adam, DO    Allergies   Allergies  Allergen Reactions   Aspirin     REACTION: anaphylaxis   Ibuprofen     REACTION: anaphylaxis    Review of Systems  ROS  Neurologic Exam  Awake, alert, oriented Memory and concentration grossly intact Speech fluent, appropriate CN grossly intact Motor exam: Upper Extremities Deltoid Bicep Tricep Grip  Right 5/5 5/5 5/5 5/5  Left 5/5 5/5 5/5 5/5   Lower Extremities IP Quad PF DF EHL  Right 5/5 5/5 5/5 5/5 5/5  Left 5/5 5/5 5/5 5/5 5/5   Sensation grossly intact to LT  Imaging  MRI was personally was personally reviewed demonstrating a small outpouching in the region of the right  posterior communicating artery possibly representing aneurysm versus infundibulum.  Impression  - 89.o. 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 All questions were answered and she provided consent to proceed.  NeConsuella LoseMD [facility]

## 2020-08-25 NOTE — Progress Notes (Signed)
Pt called states headache and vision changes describes as blind spots when she tries to focus. Pupils equal and react to light, grips equal, able to raise hands and feet on command.  No facial drooping. VS taken 97/67-62-98% Also c/o pain in groin area rates both groin and headache as a 6 on pain scale. Dr Kathyrn Sheriff  called and informed. New orders noted for tylenol

## 2020-08-25 NOTE — Sedation Documentation (Signed)
Small hematoma at right groin, expressed by IR tech. Groin level 1. Gauze and tagaderm applied. Monitoring pt

## 2020-08-25 NOTE — Sedation Documentation (Signed)
Vital signs stable. 

## 2020-08-25 NOTE — Progress Notes (Signed)
Pt states the vision change is the worse its ever been,  attempted to call Dr Kathyrn Sheriff he is in a procedure Patti from radiology coming to assess pt.

## 2020-08-25 NOTE — Sedation Documentation (Signed)
Pt arrived to [place]. Pt has no complaints at this time. Pt was able to move herself safely to the procedure table without difficulty. Vitals stable. Pt being prepped for procedure at this time.

## 2020-08-25 NOTE — Progress Notes (Signed)
Patty and Whitney from radiology in to see pt. Pt given a coke to sip on. She states that the vision is getting better. Asked Patty to ask Dr Kathyrn Sheriff to come see her after  the procedure he is in.

## 2020-08-25 NOTE — Sedation Documentation (Signed)
Report given to Lisabeth Pick RN SS at bedside and via telephone. Pt stable upon hand off in SS, RN at bedside. Vitals stable. New orders in place for NS 100 ml/h per Dr Kathyrn Sheriff. Groin level 0 at hand off.

## 2020-08-25 NOTE — Sedation Documentation (Signed)
Patient is resting comfortably. 

## 2020-08-25 NOTE — Sedation Documentation (Signed)
Pt is resting well at this time, procedure continues.

## 2020-08-25 NOTE — Sedation Documentation (Signed)
Procedure finished. Pt tolerated procedure very well.  Totals: Time: 21 mins 50 mcg fentanyl 1.5 mg versed 2000 units heparin

## 2020-08-25 NOTE — Progress Notes (Signed)
Dr Kathyrn Sheriff in to assess pt. No new orders noted.

## 2020-08-25 NOTE — Sedation Documentation (Signed)
Procedure started. Pt reports that she is very nervous. Comforted pt. See [facility]

## 2020-08-25 NOTE — Progress Notes (Signed)
Ambulated in the hallway and to the bathroom to void no bleeding noted before or after ambulation.

## 2020-08-25 NOTE — Brief Op Note (Signed)
  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 Consuella Lose, MD  ANESTHESIA: IV Sedation with Local  EBL: Minimal  SPECIMENS: None  COMPLICATIONS: None  CONDITION: Stable to recovery  FINDINGS (Full report in [facility]): 1. 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 2. Infundibulum at the origin of the left Pcom artery.   Consuella Lose, MD [facility]

## 2020-08-29 ENCOUNTER — Encounter (HOSPITAL_BASED_OUTPATIENT_CLINIC_OR_DEPARTMENT_OTHER): Payer: Self-pay | Admitting: *Deleted

## 2020-08-29 ENCOUNTER — Other Ambulatory Visit: Payer: Self-pay

## 2020-08-29 ENCOUNTER — Emergency Department (HOSPITAL_BASED_OUTPATIENT_CLINIC_OR_DEPARTMENT_OTHER)
Admission: EM | Admit: 2020-08-29 | Discharge: 2020-08-29 | Disposition: A | Discharge status: Home / Self Care | Payer: 59 | Attending: Emergency Medicine | Admitting: Emergency Medicine

## 2020-08-29 ENCOUNTER — Emergency Department (HOSPITAL_BASED_OUTPATIENT_CLINIC_OR_DEPARTMENT_OTHER): Payer: 59

## 2020-08-29 DIAGNOSIS — X58XXXA Exposure to other specified factors, initial encounter: Secondary | ICD-10-CM | POA: Insufficient documentation

## 2020-08-29 DIAGNOSIS — M79604 Pain in right leg: Secondary | ICD-10-CM | POA: Insufficient documentation

## 2020-08-29 DIAGNOSIS — Z7901 Long term (current) use of anticoagulants: Secondary | ICD-10-CM | POA: Insufficient documentation

## 2020-08-29 DIAGNOSIS — S8991XA Unspecified injury of right lower leg, initial encounter: Secondary | ICD-10-CM | POA: Diagnosis present

## 2020-08-29 DIAGNOSIS — T148XXA Other injury of unspecified body region, initial encounter: Secondary | ICD-10-CM

## 2020-08-29 DIAGNOSIS — S93602A Unspecified sprain of left foot, initial encounter: Secondary | ICD-10-CM | POA: Insufficient documentation

## 2020-08-29 MED ORDER — LIDOCAINE 5 % EX PTCH: 1.0000 | MEDICATED_PATCH | CUTANEOUS | 0 refills | Status: DC

## 2020-08-29 NOTE — ED Provider Notes (Signed)
[facility] EMERGENCY DEPARTMENT Provider Note   CSN: [id number] Arrival date & time: 08/29/20  2053     History Chief Complaint  Patient presents with   Leg Pain    Denise Arellano is a 50 y.o. female.  Patient presents with right leg pain.  She had a CT angio done on Friday, pain started after that.  She also reports having a arthroscopy be on the right knee in June.  Pain is worse with movement, somewhat relieved with Tylenol.  It does not radiate anywhere.  Feels like an aching pain.  She was worried about this being like a blood clot.     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     No family history on file.  Social History   Tobacco Use   Smoking status: Never   Smokeless tobacco: Never  Vaping Use   Vaping Use: Never used  Substance Use Topics   Alcohol use: Not Currently   Drug use: Never    Home Medications Prior to Admission medications   Medication Sig Start Date End Date Taking? Authorizing Provider  lidocaine (LIDODERM) 5 % Place 1 patch onto the skin daily. Remove & Discard patch within 12 hours or as directed by MD 08/29/20  Yes Sherrill Raring, PA-C  citalopram (CELEXA) 20 MG tablet Take 30 mg by mouth daily.    [provider]  RIVAROXABAN Alveda Reasons) VTE STARTER PACK (15 & 20 MG TABLETS) Follow package directions: Take one 42m tablet by mouth twice a day. On day 22, switch to one 262mtablet once a day. Take with food.  10/31/19   Curatolo, Adam, DO    Allergies    Aspirin and Ibuprofen  Review of Systems   Review of Systems  Respiratory:  Negative for shortness of breath.   Cardiovascular:  Negative for chest pain and leg swelling.  Musculoskeletal:        Leg pain   Physical Exam Updated Vital Signs BP 113/75 (BP Location: Right Arm)   Pulse 69   Temp 98.2 F (36.8 C) (Oral)   Resp 18   Ht 5' 1"  (1.549 m)   Wt 54.4 kg   LMP 08/14/2020   SpO2 99%   BMI 22.67 kg/m   Physical Exam Vitals and nursing note reviewed. Exam conducted with a chaperone present.  Constitutional:      General: She is not in acute distress.    Appearance: Normal appearance.  HENT:     Head: Normocephalic and atraumatic.  Eyes:     General: No scleral icterus.    Extraocular Movements: Extraocular movements intact.     Pupils: Pupils are equal, round, and reactive to light.  Cardiovascular:     Rate and Rhythm: Normal rate and regular rhythm.  Pulmonary:     Effort: Pulmonary effort is normal.     Breath sounds:  Normal breath sounds.  Musculoskeletal:     Comments: Legs are roughly symmetrical.  There is no unilateral leg swelling.  There is some bruising in the inguinal area where the femoral vein was used during the CTA.  DP and PT are 2+ bilaterally.  Range of motion intact.  Patient is able to ambulate without difficulties.  Skin:    Coloration: Skin is not jaundiced.  Neurological:     General: No focal deficit present.     Mental Status: She is alert. Mental status is at baseline.     Coordination: Coordination normal.    ED Results / Procedures / Treatments   Labs (all labs ordered are listed, but only abnormal results are displayed) Labs Reviewed - No data to display  EKG None  Radiology US Venous Img Lower Unilateral Right  Result Date: 08/29/2020 CLINICAL DATA:  Right thigh pain, status post cerebral angiogram on 07/15 EXAM: RIGHT LOWER EXTREMITY VENOUS DOPPLER ULTRASOUND TECHNIQUE:  Gray-scale sonography with compression, as well as color and duplex ultrasound, were performed to evaluate the deep venous system(s) from the level of the common femoral vein through the popliteal and proximal calf veins. COMPARISON:  None. FINDINGS: VENOUS Normal compressibility of the common femoral, superficial femoral, and popliteal veins, as well as the visualized calf veins. Visualized portions of profunda femoral vein and great saphenous vein unremarkable. No filling defects to suggest DVT on grayscale or color Doppler imaging. Doppler waveforms show normal direction of venous flow, normal respiratory plasticity and response to augmentation. Limited views of the contralateral common femoral vein are unremarkable. OTHER None. Limitations: none IMPRESSION: Negative. Electronically Signed   By: Julian Hy M.D.   On: 08/29/2020 21:43    Procedures Procedures   Medications Ordered in ED Medications - No data to display  ED Course  I have reviewed the triage vital signs and the nursing notes.  Pertinent labs & imaging results that were available during my care of the patient were reviewed by me and considered in my medical decision making (see chart for details).    MDM Rules/Calculators/A&P                           Able to ambulate without difficulties.  Suspicion for DVT given patient's recent arthroscopy.  Will order a venous ultrasound.  Her vitals are stable, she does not have any chest pain or shortness of breath.  These are both reassuring.  I do not suspect a septic joint given the pain is mostly in the musculoskeletal area.  Muscle strain is also possibility given the recent procedure use to that area as well as gait changes due to the pain from the arthroscopy.  Venous study is negative for DVT.  We discussed strict return precautions including chest pain or shortness of breath.  I suspect her pain is most likely due to a muscle strain in the inguinal area.  We will try  Lidoderm patches and Tylenol.  Return precautions given.  Follow-up instructions given. Final Clinical Impression(s) / ED Diagnoses Final diagnoses:  Muscle strain    Rx / DC Orders ED Discharge Orders          Ordered    lidocaine (LIDODERM) 5 %  Every 24 hours        08/29/20 2210             Sherrill Raring, PA-C 08/29/20 2214    Lennice Sites, DO 08/29/20 2220

## 2020-08-29 NOTE — ED Triage Notes (Signed)
C/o right thigh x 5 days

## 2020-08-29 NOTE — Discharge Instructions (Addendum)
Continue taking Tylenol as needed for pain. Apply Lidoderm patch to the area of pain every 24 hours as needed. If you develop chest pain, shortness of breath, leg swelling please return back to the ED for further evaluation. Please follow-up with your primary care doctor in the next few weeks if things do not resolve.  I suspect the pain may be there for another week.  Continue to physical therapy.

## 2020-08-30 ENCOUNTER — Other Ambulatory Visit (HOSPITAL_COMMUNITY): Payer: Self-pay | Admitting: Neurosurgery

## 2020-08-30 ENCOUNTER — Ambulatory Visit (HOSPITAL_COMMUNITY)
Admission: RE | Admit: 2020-08-30 | Discharge: 2020-08-30 | Disposition: A | Discharge status: Home / Self Care | Payer: 59 | Source: Ambulatory Visit | Attending: Neurosurgery | Admitting: Neurosurgery

## 2020-08-30 ENCOUNTER — Encounter (HOSPITAL_COMMUNITY): Payer: 59

## 2020-08-30 DIAGNOSIS — S301XXA Contusion of abdominal wall, initial encounter: Secondary | ICD-10-CM

## 2020-08-30 DIAGNOSIS — I729 Aneurysm of unspecified site: Secondary | ICD-10-CM | POA: Diagnosis not present

## 2020-08-30 NOTE — Progress Notes (Signed)
RLE arterial (R/O pseudoaneurysm) has been completed.  Attempted to call preliminary results to Dr. Kathyrn Sheriff, unavailable (left messaged)  Results can be found under chart review under CV PROC. 08/30/2020 2:44 PM Jaxan Michel RVT, RDMS

## 2020-09-21 ENCOUNTER — Other Ambulatory Visit: Payer: Self-pay | Admitting: Obstetrics and Gynecology

## 2020-09-21 DIAGNOSIS — R928 Other abnormal and inconclusive findings on diagnostic imaging of breast: Secondary | ICD-10-CM

## 2020-10-03 ENCOUNTER — Other Ambulatory Visit: Payer: Self-pay | Admitting: Obstetrics and Gynecology

## 2020-10-03 ENCOUNTER — Ambulatory Visit
Admission: RE | Admit: 2020-10-03 | Discharge: 2020-10-03 | Disposition: A | Discharge status: Home / Self Care | Payer: 59 | Source: Ambulatory Visit | Attending: Obstetrics and Gynecology | Admitting: Obstetrics and Gynecology

## 2020-10-03 ENCOUNTER — Other Ambulatory Visit: Payer: Self-pay

## 2020-10-03 DIAGNOSIS — R928 Other abnormal and inconclusive findings on diagnostic imaging of breast: Secondary | ICD-10-CM

## 2020-10-10 ENCOUNTER — Other Ambulatory Visit: Payer: Self-pay

## 2020-10-10 ENCOUNTER — Ambulatory Visit
Admission: RE | Admit: 2020-10-10 | Discharge: 2020-10-10 | Disposition: A | Discharge status: Home / Self Care | Payer: 59 | Source: Ambulatory Visit | Attending: Obstetrics and Gynecology | Admitting: Obstetrics and Gynecology

## 2020-10-10 DIAGNOSIS — R928 Other abnormal and inconclusive findings on diagnostic imaging of breast: Secondary | ICD-10-CM

## 2020-10-27 ENCOUNTER — Ambulatory Visit: Payer: Self-pay | Admitting: Surgery

## 2020-10-27 DIAGNOSIS — N6489 Other specified disorders of breast: Secondary | ICD-10-CM

## 2020-10-27 NOTE — H&P (Signed)
Subjective    Chief Complaint: Breast Mass (Right upper outer CSL/)       History of Present Illness: Denise Arellano is a 50 y.o. female who is seen today as an office consultation at the request of Dr. Brigitte Pulse for evaluation of Breast Mass (Right upper outer CSL/) .   This is a 50 year old female who presents after a recent screening mammogram showed right breast distortion.  She previously had implants but these were removed in 2021.  10/10/2020, she underwent stereotactic biopsy of this area.  Pathology returned a diagnosis of a complex sclerosing lesion.  The patient refused placement of a biopsy marker.    No family history of breast cancer in first-degree relatives   Over the last year, she has developed a slowly enlarging lipoma in the right lower back.  This is causing some discomfort.  This area has never been infected.   She has had a fairly complex medical history over the last couple of years.  She developed a clot in the basilic vein of her left upper extremity.  Follow-up scans several months later showed that this had resolved after treatment with Xarelto.  She has also had intractable migraine headaches.  She underwent cerebral angiogram which ruled out an aneurysm.     Review of Systems: A complete review of systems was obtained from the patient.  I have reviewed this information and discussed as appropriate with the patient.  See HPI as well for other ROS.   Review of Systems  Constitutional: Negative.   HENT: Negative.   Eyes: Negative.   Respiratory: Negative.   Cardiovascular: Negative.   Gastrointestinal: Negative.   Genitourinary: Negative.   Musculoskeletal: Positive for back pain and joint pain.  Skin: Negative.   Neurological: Positive for headaches.  Endo/Heme/Allergies: Negative.   Psychiatric/Behavioral: Negative.         Medical History: Past Medical History      Past Medical History:  Diagnosis Date   Anemia     Anxiety     Asthma, unspecified  asthma severity, unspecified whether complicated, unspecified whether persistent     Celiac disease     DVT (deep venous thrombosis) (CMS-HCC)     Migraine headache with aura             Patient Active Problem List  Diagnosis   Celiac sprue   Lipoma of back   Complex sclerosing lesion of right breast      Past Surgical History       Past Surgical History:  Procedure Laterality Date   AUGMENTATION MAMMOPLASTY BILATERAL W/PROSTHESIS   2007   AUGMENTATION MAMMOPLASTY BILATERAL W/PROSTHESIS       BREAST IMPLANT REMOVAL   10/25/2019   BREAST IMPLANT REMOVAL       meniscus tear            Allergies       Allergies  Allergen Reactions   Aspirin Anaphylaxis      REACTION: anaphylaxis REACTION: anaphylaxis REACTION: anaphylaxis     Nsaids (Non-Steroidal Anti-Inflammatory Drug) Anaphylaxis              Current Outpatient Medications on File Prior to Visit  Medication Sig Dispense Refill   citalopram (CELEXA) 20 MG tablet citalopram 20 mg tablet  30 mg       rosuvastatin (CRESTOR) 10 MG tablet Take 10 mg by mouth once daily        No current facility-administered medications on file prior to visit.  Family History       Family History  Problem Relation Age of Onset   High blood pressure (Hypertension) Mother     Hyperlipidemia (Elevated cholesterol) Mother          Social History       Tobacco Use  Smoking Status Former Smoker  Smokeless Tobacco Never Used      Social History  Social History        Socioeconomic History   Marital status: Married  Tobacco Use   Smoking status: Former Smoker   Smokeless tobacco: Never Used  Scientific laboratory technician Use: Never used  Substance and Sexual Activity   Alcohol use: Yes   Drug use: Never        Objective:         Vitals:    10/27/20 0908  BP: 118/60  Pulse: 87  Temp: 36.6 C (97.9 F)  SpO2: 99%  Weight: 58.4 kg (128 lb 12.8 oz)  Height: 152.4 cm (5')    Body mass index is 25.15 kg/m.    Physical Exam    Constitutional:  WDWN in NAD, conversant, no obvious deformities; lying in bed comfortably Eyes:  Pupils equal, round; sclera anicteric; moist conjunctiva; no lid lag HENT:  Oral mucosa moist; good dentition  Neck:  No masses palpated, trachea midline; no thyromegaly Lungs:  CTA bilaterally; normal respiratory effort Breasts:  symmetric, no nipple changes; no palpable masses or lymphadenopathy on either side CV:  Regular rate and rhythm; no murmurs; extremities well-perfused with no edema Abd:  +bowel sounds, soft, non-tender, no palpable organomegaly; no palpable hernias Musc:  Unable to assess gait; no apparent clubbing or cyanosis in extremities Lymphatic:  No palpable cervical or axillary lymphadenopathy Back: Right lumbar region there is a easily palpable 2.5 cm subcutaneous mass.  This is smooth and mobile.  No sign of overlying skin involvement. Skin:  Warm, dry; no sign of jaundice Psychiatric - alert and oriented x 4; calm mood and affect     Labs, Imaging and Diagnostic Testing: CLINICAL DATA:  50 year old female presenting as a recall from screening for possible right breast distortion. Patient has had interval removal of her implants.   EXAM: DIGITAL DIAGNOSTIC UNILATERAL RIGHT MAMMOGRAM WITH TOMOSYNTHESIS AND CAD; ULTRASOUND RIGHT BREAST LIMITED   TECHNIQUE: Right digital diagnostic mammography and breast tomosynthesis was performed. The images were evaluated with computer-aided detection.; Targeted ultrasound examination of the right breast was performed   COMPARISON:  Previous exam(s).   ACR Breast Density Category c: The breast tissue is heterogeneously dense, which may obscure small masses.   FINDINGS: Mammogram:   Right breast: Spot compression tomosynthesis and full field mL tomosynthesis views of the right breast were performed. There is persistence of distortion in the upper-outer right breast without a definite mass.   Ultrasound:    Targeted ultrasound was performed throughout the upper-outer quadrant of the right breast demonstrating no cystic or solid mass or focal area of shadowing.   Targeted ultrasound of the right axilla demonstrates normal lymph nodes.   IMPRESSION: Persistent distortion without sonographic correlate in the upper-outer quadrant of the right breast. This could be related to patient's recent implant removal surgery.   RECOMMENDATION: Stereotactic core needle biopsy of the right breast distortion.   I have discussed the findings and recommendations with the patient who agrees to proceed with biopsy. The patient will be scheduled for the biopsy appointment prior to leaving the office today.   BI-RADS CATEGORY  4:  Suspicious.     Electronically Signed   By: Audie Pinto M.D.   On: 10/03/2020 11:46   CLINICAL DATA:  Biopsy right breast distortion   EXAM: RIGHT BREAST STEREOTACTIC CORE NEEDLE BIOPSY   COMPARISON:  Previous exams.   FINDINGS: The patient and I discussed the procedure of stereotactic-guided biopsy including benefits and alternatives. We discussed the high likelihood of a successful procedure. We discussed the risks of the procedure including infection, bleeding, tissue injury, clip migration, and inadequate sampling. Informed written consent was given. The usual time out protocol was performed immediately prior to the procedure.   Using sterile technique and 1% Lidocaine as local anesthetic, under stereotactic guidance, a 9 gauge vacuum assisted device was used to perform core needle biopsy of distortion in the upper outer right breast using a lateral approach.   Lesion quadrant: Upper outer right breast   At the conclusion of the procedure, a tissue marker clip was deployed into the biopsy cavity. Follow-up 2-view mammogram was performed and dictated separately.   IMPRESSION: Stereotactic-guided biopsy of distortion in the upper outer right breast. No  apparent complications.   Electronically Signed: By: Dorise Bullion III M.D. 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  We discussed the fact that these lesions are not cancerous but they do appear suspicious on mammogram.  Leaving this in place would likely involve future repeated biopsies, especially if there was any change in the appearance.  She would like to go ahead and have this removed now.   Recommend right radioactive seed localized lumpectomy and excision of right lower back subcutaneous lipoma..  The surgical procedure has been discussed with the patient.  Potential risks, benefits, alternative treatments, and expected outcomes have been explained.  All of the patient's questions at this time have been answered.  The likelihood of reaching the patient's treatment goal is good.  The patient understand the proposed surgical procedure and wishes to proceed.     No follow-ups on file.   Carlean Jews, MD  10/27/2020 12:14 PM

## 2020-10-31 ENCOUNTER — Other Ambulatory Visit: Payer: Self-pay | Admitting: Surgery

## 2020-10-31 DIAGNOSIS — N6489 Other specified disorders of breast: Secondary | ICD-10-CM

## 2020-11-08 ENCOUNTER — Other Ambulatory Visit (HOSPITAL_COMMUNITY): Payer: Self-pay | Admitting: Respiratory Therapy

## 2020-11-08 DIAGNOSIS — J4520 Mild intermittent asthma, uncomplicated: Secondary | ICD-10-CM

## 2020-11-28 DIAGNOSIS — Q049 Congenital malformation of brain, unspecified: Secondary | ICD-10-CM

## 2020-11-28 HISTORY — DX: Congenital malformation of brain, unspecified: Q04.9

## 2020-12-15 ENCOUNTER — Encounter (HOSPITAL_BASED_OUTPATIENT_CLINIC_OR_DEPARTMENT_OTHER): Payer: Self-pay | Admitting: Surgery

## 2020-12-18 ENCOUNTER — Encounter (HOSPITAL_BASED_OUTPATIENT_CLINIC_OR_DEPARTMENT_OTHER): Payer: Self-pay | Admitting: Surgery

## 2020-12-18 ENCOUNTER — Other Ambulatory Visit: Payer: Self-pay

## 2020-12-22 ENCOUNTER — Ambulatory Visit
Admission: RE | Admit: 2020-12-22 | Discharge: 2020-12-22 | Disposition: A | Discharge status: Home / Self Care | Payer: 59 | Source: Ambulatory Visit | Attending: Surgery | Admitting: Surgery

## 2020-12-22 ENCOUNTER — Other Ambulatory Visit: Payer: Self-pay

## 2020-12-22 DIAGNOSIS — N6489 Other specified disorders of breast: Secondary | ICD-10-CM

## 2020-12-22 NOTE — Progress Notes (Signed)

## 2020-12-25 ENCOUNTER — Encounter (HOSPITAL_BASED_OUTPATIENT_CLINIC_OR_DEPARTMENT_OTHER): Payer: Self-pay | Admitting: Surgery

## 2020-12-25 ENCOUNTER — Other Ambulatory Visit: Payer: Self-pay

## 2020-12-25 ENCOUNTER — Ambulatory Visit
Admission: RE | Admit: 2020-12-25 | Discharge: 2020-12-25 | Disposition: A | Discharge status: Home / Self Care | Payer: 59 | Source: Ambulatory Visit | Attending: Surgery | Admitting: Surgery

## 2020-12-25 ENCOUNTER — Ambulatory Visit (HOSPITAL_BASED_OUTPATIENT_CLINIC_OR_DEPARTMENT_OTHER): Payer: 59 | Admitting: Anesthesiology

## 2020-12-25 ENCOUNTER — Encounter (HOSPITAL_BASED_OUTPATIENT_CLINIC_OR_DEPARTMENT_OTHER)
Admission: RE | Disposition: A | Discharge status: Home / Self Care | Payer: Self-pay | Source: Home / Self Care | Attending: Surgery

## 2020-12-25 ENCOUNTER — Ambulatory Visit (HOSPITAL_BASED_OUTPATIENT_CLINIC_OR_DEPARTMENT_OTHER)
Admission: RE | Admit: 2020-12-25 | Discharge: 2020-12-25 | Disposition: A | Discharge status: Home / Self Care | Payer: 59 | Attending: Surgery | Admitting: Surgery

## 2020-12-25 DIAGNOSIS — N6489 Other specified disorders of breast: Secondary | ICD-10-CM

## 2020-12-25 DIAGNOSIS — N6021 Fibroadenosis of right breast: Secondary | ICD-10-CM | POA: Insufficient documentation

## 2020-12-25 DIAGNOSIS — D171 Benign lipomatous neoplasm of skin and subcutaneous tissue of trunk: Secondary | ICD-10-CM | POA: Insufficient documentation

## 2020-12-25 HISTORY — DX: Anxiety disorder, unspecified: F41.9

## 2020-12-25 HISTORY — PX: LIPOMA EXCISION: SHX5283

## 2020-12-25 HISTORY — PX: BREAST LUMPECTOMY WITH RADIOACTIVE SEED LOCALIZATION: SHX6424

## 2020-12-25 HISTORY — DX: Unspecified lump in unspecified breast: N63.0

## 2020-12-25 HISTORY — DX: Benign lipomatous neoplasm of skin and subcutaneous tissue of trunk: D17.1

## 2020-12-25 LAB — POCT PREGNANCY, URINE: Preg Test, Ur: NEGATIVE

## 2020-12-25 SURGERY — BREAST LUMPECTOMY WITH RADIOACTIVE SEED LOCALIZATION
Anesthesia: General | Site: Breast | Laterality: Right

## 2020-12-25 MED ORDER — CHLORHEXIDINE GLUCONATE CLOTH 2 % EX PADS: 6.0000 | MEDICATED_PAD | Freq: Once | CUTANEOUS | Status: DC

## 2020-12-25 MED ORDER — HYDROCODONE-ACETAMINOPHEN 5-325 MG PO TABS: 1.0000 | ORAL_TABLET | Freq: Four times a day (QID) | ORAL | 0 refills | Status: DC | PRN

## 2020-12-25 MED ORDER — FENTANYL CITRATE (PF) 100 MCG/2ML IJ SOLN: 25.0000 ug | INTRAMUSCULAR | Status: DC | PRN

## 2020-12-25 MED ORDER — CEFAZOLIN SODIUM-DEXTROSE 2-4 GM/100ML-% IV SOLN: 2.0000 g | INTRAVENOUS | Status: DC

## 2020-12-25 MED ORDER — ONDANSETRON HCL 4 MG/2ML IJ SOLN
INTRAMUSCULAR | Status: AC
  Filled 2020-12-25: qty 2

## 2020-12-25 MED ORDER — DEXAMETHASONE SODIUM PHOSPHATE 10 MG/ML IJ SOLN
INTRAMUSCULAR | Status: AC
  Filled 2020-12-25: qty 1

## 2020-12-25 MED ORDER — FENTANYL CITRATE (PF) 100 MCG/2ML IJ SOLN
INTRAMUSCULAR | Status: AC
  Filled 2020-12-25: qty 2

## 2020-12-25 MED ORDER — LIDOCAINE HCL (CARDIAC) PF 100 MG/5ML IV SOSY
PREFILLED_SYRINGE | INTRAVENOUS | Status: DC | PRN
  Administered 2020-12-25: 40 mg via INTRATRACHEAL

## 2020-12-25 MED ORDER — EPINEPHRINE PF 1 MG/ML IJ SOLN
INTRAMUSCULAR | Status: AC
  Filled 2020-12-25: qty 8

## 2020-12-25 MED ORDER — ACETAMINOPHEN 500 MG PO TABS
ORAL_TABLET | ORAL | Status: AC
  Filled 2020-12-25: qty 2

## 2020-12-25 MED ORDER — BUPIVACAINE-EPINEPHRINE (PF) 0.25% -1:200000 IJ SOLN
INTRAMUSCULAR | Status: AC
  Filled 2020-12-25: qty 120

## 2020-12-25 MED ORDER — PROPOFOL 10 MG/ML IV BOLUS
INTRAVENOUS | Status: DC | PRN
  Administered 2020-12-25: 200 mg via INTRAVENOUS

## 2020-12-25 MED ORDER — EPHEDRINE SULFATE 50 MG/ML IJ SOLN
INTRAMUSCULAR | Status: DC | PRN
  Administered 2020-12-25: 5 mg via INTRAVENOUS

## 2020-12-25 MED ORDER — DEXAMETHASONE SODIUM PHOSPHATE 10 MG/ML IJ SOLN
INTRAMUSCULAR | Status: DC | PRN
  Administered 2020-12-25: 4 mg via INTRAVENOUS

## 2020-12-25 MED ORDER — PROPOFOL 10 MG/ML IV BOLUS
INTRAVENOUS | Status: AC
  Filled 2020-12-25: qty 20

## 2020-12-25 MED ORDER — FENTANYL CITRATE (PF) 100 MCG/2ML IJ SOLN
INTRAMUSCULAR | Status: DC | PRN
  Administered 2020-12-25: 50 ug via INTRAVENOUS
  Administered 2020-12-25: 25 ug via INTRAVENOUS

## 2020-12-25 MED ORDER — LIDOCAINE 2% (20 MG/ML) 5 ML SYRINGE
INTRAMUSCULAR | Status: AC
  Filled 2020-12-25: qty 5

## 2020-12-25 MED ORDER — ACETAMINOPHEN 500 MG PO TABS
1000.0000 mg | ORAL_TABLET | ORAL | Status: AC
  Administered 2020-12-25: 1000 mg via ORAL

## 2020-12-25 MED ORDER — CEFAZOLIN SODIUM-DEXTROSE 2-4 GM/100ML-% IV SOLN
2.0000 g | INTRAVENOUS | Status: AC
  Administered 2020-12-25: 2 g via INTRAVENOUS

## 2020-12-25 MED ORDER — CEFAZOLIN SODIUM-DEXTROSE 2-4 GM/100ML-% IV SOLN
INTRAVENOUS | Status: AC
  Filled 2020-12-25: qty 100

## 2020-12-25 MED ORDER — ONDANSETRON HCL 4 MG/2ML IJ SOLN
INTRAMUSCULAR | Status: DC | PRN
  Administered 2020-12-25: 4 mg via INTRAVENOUS

## 2020-12-25 MED ORDER — LACTATED RINGERS IV SOLN: INTRAVENOUS | Status: DC

## 2020-12-25 MED ORDER — EPHEDRINE 5 MG/ML INJ
INTRAVENOUS | Status: AC
  Filled 2020-12-25: qty 5

## 2020-12-25 MED ORDER — MIDAZOLAM HCL 2 MG/2ML IJ SOLN
INTRAMUSCULAR | Status: AC
  Filled 2020-12-25: qty 2

## 2020-12-25 MED ORDER — BUPIVACAINE-EPINEPHRINE 0.25% -1:200000 IJ SOLN
INTRAMUSCULAR | Status: DC | PRN
  Administered 2020-12-25: 7 mL
  Administered 2020-12-25: 13 mL

## 2020-12-25 MED ORDER — MIDAZOLAM HCL 5 MG/5ML IJ SOLN
INTRAMUSCULAR | Status: DC | PRN
  Administered 2020-12-25: 2 mg via INTRAVENOUS

## 2020-12-25 SURGICAL SUPPLY — 57 items
APL PRP STRL LF DISP 70% ISPRP (MISCELLANEOUS) ×4
APL SKNCLS STERI-STRIP NONHPOA (GAUZE/BANDAGES/DRESSINGS) ×4
APPLIER CLIP 9.375 MED OPEN (MISCELLANEOUS) ×4
APR CLP MED 9.3 20 MLT OPN (MISCELLANEOUS) ×2
BENZOIN TINCTURE PRP APPL 2/3 (GAUZE/BANDAGES/DRESSINGS) ×8 IMPLANT
BLADE CLIPPER SURG (BLADE) IMPLANT
BLADE HEX COATED 2.75 (ELECTRODE) ×4 IMPLANT
BLADE SURG 15 STRL LF DISP TIS (BLADE) ×2 IMPLANT
BLADE SURG 15 STRL SS (BLADE) ×4
CANISTER SUC SOCK COL 7IN (MISCELLANEOUS) IMPLANT
CANISTER SUCT 1200ML W/VALVE (MISCELLANEOUS) IMPLANT
CHLORAPREP W/TINT 26 (MISCELLANEOUS) ×8 IMPLANT
CLIP APPLIE 9.375 MED OPEN (MISCELLANEOUS) ×2 IMPLANT
CLOSURE WOUND 1/2 X4 (GAUZE/BANDAGES/DRESSINGS) ×2
COVER BACK TABLE 60X90IN (DRAPES) ×4 IMPLANT
COVER MAYO STAND STRL (DRAPES) ×4 IMPLANT
COVER PROBE W GEL 5X96 (DRAPES) ×4 IMPLANT
DECANTER SPIKE VIAL GLASS SM (MISCELLANEOUS) IMPLANT
DRAPE LAPAROTOMY 100X72 PEDS (DRAPES) ×8 IMPLANT
DRAPE UTILITY XL STRL (DRAPES) ×8 IMPLANT
DRSG TEGADERM 4X4.75 (GAUZE/BANDAGES/DRESSINGS) ×8 IMPLANT
ELECT COATED BLADE 2.86 ST (ELECTRODE) ×4 IMPLANT
ELECT REM PT RETURN 9FT ADLT (ELECTROSURGICAL) ×4
ELECTRODE REM PT RTRN 9FT ADLT (ELECTROSURGICAL) ×2 IMPLANT
GAUZE SPONGE 4X4 12PLY STRL LF (GAUZE/BANDAGES/DRESSINGS) IMPLANT
GLOVE SURG ENC MOIS LTX SZ7 (GLOVE) ×4 IMPLANT
GLOVE SURG UNDER POLY LF SZ7.5 (GLOVE) ×4 IMPLANT
GOWN STRL REUS W/ TWL LRG LVL3 (GOWN DISPOSABLE) ×6 IMPLANT
GOWN STRL REUS W/TWL LRG LVL3 (GOWN DISPOSABLE) ×12
ILLUMINATOR WAVEGUIDE N/F (MISCELLANEOUS) IMPLANT
KIT MARKER MARGIN INK (KITS) ×4 IMPLANT
LIGHT WAVEGUIDE WIDE FLAT (MISCELLANEOUS) IMPLANT
NEEDLE HYPO 25X1 1.5 SAFETY (NEEDLE) ×4 IMPLANT
NS IRRIG 1000ML POUR BTL (IV SOLUTION) ×4 IMPLANT
PACK BASIN DAY SURGERY FS (CUSTOM PROCEDURE TRAY) ×4 IMPLANT
PENCIL SMOKE EVACUATOR (MISCELLANEOUS) ×4 IMPLANT
SHEET MEDIUM DRAPE 40X70 STRL (DRAPES) IMPLANT
SLEEVE SCD COMPRESS KNEE MED (STOCKING) ×4 IMPLANT
SPONGE GAUZE 2X2 8PLY STER LF (GAUZE/BANDAGES/DRESSINGS)
SPONGE GAUZE 2X2 8PLY STRL LF (GAUZE/BANDAGES/DRESSINGS) IMPLANT
SPONGE T-LAP 18X18 ~~LOC~~+RFID (SPONGE) IMPLANT
SPONGE T-LAP 4X18 ~~LOC~~+RFID (SPONGE) ×4 IMPLANT
STRIP CLOSURE SKIN 1/2X4 (GAUZE/BANDAGES/DRESSINGS) ×6 IMPLANT
SUT MON AB 4-0 PC3 18 (SUTURE) ×8 IMPLANT
SUT PROLENE 6 0 P 1 18 (SUTURE) IMPLANT
SUT SILK 2 0 PERMA HAND 18 BK (SUTURE) IMPLANT
SUT SILK 2 0 SH (SUTURE) IMPLANT
SUT VIC AB 3-0 SH 27 (SUTURE) ×8
SUT VIC AB 3-0 SH 27X BRD (SUTURE) ×4 IMPLANT
SUT VICRYL 3-0 CR8 SH (SUTURE) IMPLANT
SYR BULB EAR ULCER 3OZ GRN STR (SYRINGE) ×4 IMPLANT
SYR CONTROL 10ML LL (SYRINGE) ×8 IMPLANT
TOWEL GREEN STERILE FF (TOWEL DISPOSABLE) ×4 IMPLANT
TRAY FAXITRON CT DISP (TRAY / TRAY PROCEDURE) ×4 IMPLANT
TUBE CONNECTING 20'X1/4 (TUBING) ×1
TUBE CONNECTING 20X1/4 (TUBING) ×3 IMPLANT
YANKAUER SUCT BULB TIP NO VENT (SUCTIONS) ×4 IMPLANT

## 2020-12-25 NOTE — Anesthesia Postprocedure Evaluation (Signed)
Anesthesia Post Note  Patient: Denise Arellano  Procedure(s) Performed: RIGHT BREAST LUMPECTOMY WITH RADIOACTIVE SEED LOCALIZATION (Right: Breast) EXCISION OF RIGHT LOWER BACK LIPOMA (Right: Back)     Patient location during evaluation: PACU Anesthesia Type: General Level of consciousness: awake and alert Pain management: pain level controlled Vital Signs Assessment: post-procedure vital signs reviewed and stable Respiratory status: spontaneous breathing, nonlabored ventilation, respiratory function stable and patient connected to nasal cannula oxygen Cardiovascular status: blood pressure returned to baseline and stable Postop Assessment: no apparent nausea or vomiting Anesthetic complications: no   No notable events documented.  Last Vitals:  Vitals:   12/25/20 0915 12/25/20 0950  BP: 120/74 101/85  Pulse: 70 72  Resp: 16 16  Temp:  36.5 C  SpO2: 100% 96%    Last Pain:  Vitals:   12/25/20 0950  TempSrc:   PainSc: 3                  [name] [name]

## 2020-12-25 NOTE — H&P (Addendum)
Subjective    Chief Complaint: Breast Mass (Right upper outer CSL/)       History of Present Illness: Denise Arellano is a 50 y.o. female who is seen today as an office consultation at the request of Dr. Brigitte Pulse for evaluation of Breast Mass (Right upper outer CSL/) .   This is a 50 year old female who presents after a recent screening mammogram showed right breast distortion.  She previously had implants but these were removed in 2021.  10/10/2020, she underwent stereotactic biopsy of this area.  Pathology returned a diagnosis of a complex sclerosing lesion.  The patient refused placement of a biopsy marker.    No family history of breast cancer in first-degree relatives   Over the last year, she has developed a slowly enlarging lipoma in the right lower back.  This is causing some discomfort.  This area has never been infected.   She has had a fairly complex medical history over the last couple of years.  She developed a clot in the basilic vein of her left upper extremity.  Follow-up scans several months later showed that this had resolved after treatment with Xarelto.  She has also had intractable migraine headaches.  She underwent cerebral angiogram which ruled out an aneurysm.     Review of Systems: A complete review of systems was obtained from the patient.  I have reviewed this information and discussed as appropriate with the patient.  See HPI as well for other ROS.   Review of Systems  Constitutional: Negative.   HENT: Negative.   Eyes: Negative.   Respiratory: Negative.   Cardiovascular: Negative.   Gastrointestinal: Negative.   Genitourinary: Negative.   Musculoskeletal: Positive for back pain and joint pain.  Skin: Negative.   Neurological: Positive for headaches.  Endo/Heme/Allergies: Negative.   Psychiatric/Behavioral: Negative.         Medical History: Past Medical History         Past Medical History:  Diagnosis Date   Anemia     Anxiety     Asthma,  unspecified asthma severity, unspecified whether complicated, unspecified whether persistent     Celiac disease     DVT (deep venous thrombosis) (CMS-HCC)     Migraine headache with aura               Patient Active Problem List  Diagnosis   Celiac sprue   Lipoma of back   Complex sclerosing lesion of right breast      Past Surgical History           Past Surgical History:  Procedure Laterality Date   AUGMENTATION MAMMOPLASTY BILATERAL W/PROSTHESIS   2007   AUGMENTATION MAMMOPLASTY BILATERAL W/PROSTHESIS       BREAST IMPLANT REMOVAL   10/25/2019   BREAST IMPLANT REMOVAL       meniscus tear            Allergies           Allergies  Allergen Reactions   Aspirin Anaphylaxis      REACTION: anaphylaxis REACTION: anaphylaxis REACTION: anaphylaxis     Nsaids (Non-Steroidal Anti-Inflammatory Drug) Anaphylaxis                   Current Outpatient Medications on File Prior to Visit  Medication Sig Dispense Refill   citalopram (CELEXA) 20 MG tablet citalopram 20 mg tablet  30 mg       rosuvastatin (CRESTOR) 10 MG tablet Take 10 mg by mouth once daily  No current facility-administered medications on file prior to visit.      Family History           Family History  Problem Relation Age of Onset   High blood pressure (Hypertension) Mother     Hyperlipidemia (Elevated cholesterol) Mother          Social History         Tobacco Use  Smoking Status Former Smoker  Smokeless Tobacco Never Used      Social History  Social History           Socioeconomic History   Marital status: Married  Tobacco Use   Smoking status: Former Smoker   Smokeless tobacco: Never Used  Scientific laboratory technician Use: Never used  Substance and Sexual Activity   Alcohol use: Yes   Drug use: Never        Objective:           Vitals:     BP: 118/60  Pulse: 87  Temp: 36.6 C (97.9 F)  SpO2: 99%  Weight: 58.4 kg (128 lb 12.8 oz)  Height: 152.4 cm (5')    Body mass  index is 25.15 kg/m.   Physical Exam    Constitutional:  WDWN in NAD, conversant, no obvious deformities; lying in bed comfortably Eyes:  Pupils equal, round; sclera anicteric; moist conjunctiva; no lid lag HENT:  Oral mucosa moist; good dentition  Neck:  No masses palpated, trachea midline; no thyromegaly Lungs:  CTA bilaterally; normal respiratory effort Breasts:  symmetric, no nipple changes; no palpable masses or lymphadenopathy on either side CV:  Regular rate and rhythm; no murmurs; extremities well-perfused with no edema Abd:  +bowel sounds, soft, non-tender, no palpable organomegaly; no palpable hernias Musc:  Unable to assess gait; no apparent clubbing or cyanosis in extremities Lymphatic:  No palpable cervical or axillary lymphadenopathy Back: Right lumbar region there is a easily palpable 2.5 cm subcutaneous mass.  This is smooth and mobile.  No sign of overlying skin involvement. Skin:  Warm, dry; no sign of jaundice Psychiatric - alert and oriented x 4; calm mood and affect     Labs, Imaging and Diagnostic Testing: CLINICAL DATA:  50 year old female presenting as a recall from screening for possible right breast distortion. Patient has had interval removal of her implants.   EXAM: DIGITAL DIAGNOSTIC UNILATERAL RIGHT MAMMOGRAM WITH TOMOSYNTHESIS AND CAD; ULTRASOUND RIGHT BREAST LIMITED   TECHNIQUE: Right digital diagnostic mammography and breast tomosynthesis was performed. The images were evaluated with computer-aided detection.; Targeted ultrasound examination of the right breast was performed   COMPARISON:  Previous exam(s).   ACR Breast Density Category c: The breast tissue is heterogeneously dense, which may obscure small masses.   FINDINGS: Mammogram:   Right breast: Spot compression tomosynthesis and full field mL tomosynthesis views of the right breast were performed. There is persistence of distortion in the upper-outer right breast without a definite  mass.   Ultrasound:   Targeted ultrasound was performed throughout the upper-outer quadrant of the right breast demonstrating no cystic or solid mass or focal area of shadowing.   Targeted ultrasound of the right axilla demonstrates normal lymph nodes.   IMPRESSION: Persistent distortion without sonographic correlate in the upper-outer quadrant of the right breast. This could be related to patient's recent implant removal surgery.   RECOMMENDATION: Stereotactic core needle biopsy of the right breast distortion.   I have discussed the findings and recommendations with the patient who agrees to proceed  with biopsy. The patient will be scheduled for the biopsy appointment prior to leaving the office today.   BI-RADS CATEGORY  4: Suspicious.     Electronically Signed   By: Audie Pinto M.D.   On: 10/03/2020 11:46   CLINICAL DATA:  Biopsy right breast distortion   EXAM: RIGHT BREAST STEREOTACTIC CORE NEEDLE BIOPSY   COMPARISON:  Previous exams.   FINDINGS: The patient and I discussed the procedure of stereotactic-guided biopsy including benefits and alternatives. We discussed the high likelihood of a successful procedure. We discussed the risks of the procedure including infection, bleeding, tissue injury, clip migration, and inadequate sampling. Informed written consent was given. The usual time out protocol was performed immediately prior to the procedure.   Using sterile technique and 1% Lidocaine as local anesthetic, under stereotactic guidance, a 9 gauge vacuum assisted device was used to perform core needle biopsy of distortion in the upper outer right breast using a lateral approach.   Lesion quadrant: Upper outer right breast   At the conclusion of the procedure, a tissue marker clip was deployed into the biopsy cavity. Follow-up 2-view mammogram was performed and dictated separately.   IMPRESSION: Stereotactic-guided biopsy of distortion in the upper  outer right breast. No apparent complications.   Electronically Signed: By: Dorise Bullion III M.D. 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  We discussed the fact that these lesions are not cancerous but they do appear suspicious on mammogram.  Leaving this in place would likely involve future repeated biopsies, especially if there was any change in the appearance.  She would like to go ahead and have this removed now.   Recommend right radioactive seed localized lumpectomy and excision of right lower back subcutaneous lipoma..  The surgical procedure has been discussed with the patient.  Potential risks, benefits, alternative treatments, and expected outcomes have been explained.  All of the patient's questions at this time have been answered.  The likelihood of reaching the patient's treatment goal is good.  The patient understand the proposed surgical procedure and wishes to proceed.     Imogene Burn. Georgette Dover, MD, [facility]  General Surgery   12/25/2020 6:37 AM

## 2020-12-25 NOTE — Anesthesia Preprocedure Evaluation (Addendum)
Anesthesia Evaluation  Patient identified by MRN, date of birth, ID band Patient awake    Reviewed: Allergy & Precautions, NPO status , Patient's Chart, lab work & pertinent test results  Airway Mallampati: II  TM Distance: >3 FB Neck ROM: Full    Dental no notable dental hx. (+) Teeth Intact, Dental Advisory Given   Pulmonary asthma ,    Pulmonary exam normal breath sounds clear to auscultation       Cardiovascular + DVT  Normal cardiovascular exam Rhythm:Regular Rate:Normal     Neuro/Psych  Headaches, negative psych ROS   GI/Hepatic negative GI ROS, Neg liver ROS,   Endo/Other  negative endocrine ROS  Renal/GU negative Renal ROS  negative genitourinary   Musculoskeletal negative musculoskeletal ROS (+)   Abdominal   Peds  Hematology negative hematology ROS (+)   Anesthesia Other Findings   Reproductive/Obstetrics                            Anesthesia Physical Anesthesia Plan  ASA: 2  Anesthesia Plan: General   Post-op Pain Management:    Induction: Intravenous  PONV Risk Score and Plan: 3 and Ondansetron, Dexamethasone and Midazolam  Airway Management Planned: LMA  Additional Equipment:   Intra-op Plan:   Post-operative Plan: Extubation in OR  Informed Consent: I have reviewed the patients History and Physical, chart, labs and discussed the procedure including the risks, benefits and alternatives for the proposed anesthesia with the patient or authorized representative who has indicated his/her understanding and acceptance.     Dental advisory given  Plan Discussed with: CRNA  Anesthesia Plan Comments:         Anesthesia Quick Evaluation

## 2020-12-25 NOTE — Anesthesia Procedure Notes (Signed)
Procedure Name: LMA Insertion Date/Time: 12/25/2020 7:35 AM Performed by: Glory Buff, CRNA Pre-anesthesia Checklist: Patient identified, Emergency Drugs available, Suction available and Patient being monitored Patient Re-evaluated:Patient Re-evaluated prior to induction Oxygen Delivery Method: Circle system utilized Preoxygenation: Pre-oxygenation with 100% oxygen Induction Type: IV induction Ventilation: Mask ventilation without difficulty LMA: LMA inserted LMA Size: 3.0 Number of attempts: 1 Placement Confirmation: positive ETCO2 Tube secured with: Tape Dental Injury: Teeth and Oropharynx as per pre-operative assessment

## 2020-12-25 NOTE — Op Note (Signed)
Pre-op Diagnosis:  Right breast complex sclerosing lesion/ right lumbar subcutaneous lipoma Post-op Diagnosis: same Procedure: Right radioactive seed localized lumpectomy/ excision of right lumbar subcutaneous lipoma Surgeon:  Maia Petties. Anesthesia:  GEN - LMA Indications:  This is a 50 year old female who presents after a recent screening mammogram showed right breast distortion.  She previously had implants but these were removed in 2021.  10/10/2020, she underwent stereotactic biopsy of this area.  Pathology returned a diagnosis of a complex sclerosing lesion.  The patient refused placement of a biopsy marker.    No family history of breast cancer in first-degree relatives   Over the last year, she has developed a slowly enlarging lipoma in the right lower back.  This is causing some discomfort.  This area has never been infected.   Description of procedure: The patient is brought to the operating room placed in supine position on the operating room table. After an adequate level of general anesthesia was obtained, she was moved to a lateral position on the OR table on a bean bag.  Her right lower back was prepped with Chloraprep and draped in sterile fashion.  A timeout was taken.  We infiltrated the area over the palpable mass with 0.25 % Marcaine with epinephrine.  A transverse incision was made over the mass.  We dissected down to the mass.  I dissected completely around the mass, dissecting it off of the underlying fascia.  Hemostasis was obtained with cautery.  The wound was closed with 3-0 Vicryl and 4-0 Monocryl.  Benzoin and sterile strips were applied along with a sterile dressing.  She was then moved back to a supine position.  Her right breast was prepped with ChloraPrep and draped in sterile fashion. A timeout was taken to ensure the proper patient and proper procedure. We interrogated the breast with the neoprobe. We made a transverse incision in the lateral right breast after  infiltrating with 0.25% Marcaine. Dissection was carried down in the breast tissue with cautery. We used the neoprobe to guide [place] towards the radioactive seed. We excised an area of tissue around the radioactive seed 2 cm in diameter. The specimen was removed and was oriented with a paint kit. Specimen mammogram showed the radioactive seed within the specimen. This was sent for pathologic examination. There is no residual radioactivity within the biopsy cavity. We inspected carefully for hemostasis. The wound was thoroughly irrigated. The wound was closed with a deep layer of 3-0 Vicryl and a subcuticular layer of 4-0 Monocryl. Benzoin Steri-Strips were applied. The patient was then extubated and brought to the recovery room in stable condition. All sponge, instrument, and needle counts are correct.  Imogene Burn. Georgette Dover, MD, [facility] Surgery  General/ Trauma Surgery  12/25/2020 8:38 AM

## 2020-12-25 NOTE — Transfer of Care (Signed)
Immediate Anesthesia Transfer of Care Note  Patient: Denise Arellano  Procedure(s) Performed: RIGHT BREAST LUMPECTOMY WITH RADIOACTIVE SEED LOCALIZATION (Right: Breast) EXCISION OF RIGHT LOWER BACK LIPOMA (Right: Back)  Patient Location: PACU  Anesthesia Type:General  Level of Consciousness: drowsy, patient cooperative and responds to stimulation  Airway & Oxygen Therapy: Patient Spontanous Breathing and Patient connected to face mask oxygen  Post-op Assessment: Report given to RN and Post -op Vital signs reviewed and stable  Post vital signs: Reviewed and stable  Last Vitals:  Vitals Value Taken Time  BP    Temp    Pulse 86 12/25/20 0844  Resp    SpO2 100 % 12/25/20 0844  Vitals shown include unvalidated device data.  Last Pain:  Vitals:   12/25/20 0622  TempSrc: Oral  PainSc: 0-No pain         Complications: No notable events documented.

## 2020-12-25 NOTE — Discharge Instructions (Addendum)
[place] Office Phone Number [phone]  BREAST BIOPSY/ PARTIAL MASTECTOMY: POST OP INSTRUCTIONS  Always review your discharge instruction sheet given to you by the facility where your surgery was performed.  IF YOU HAVE DISABILITY OR FAMILY LEAVE FORMS, YOU MUST BRING THEM TO THE OFFICE FOR PROCESSING.  DO NOT GIVE THEM TO YOUR DOCTOR.  A prescription for pain medication may be given to you upon discharge.  Take your pain medication as prescribed, if needed.  If narcotic pain medicine is not needed, then you may take acetaminophen (Tylenol) or ibuprofen (Advil) as needed. No Tylenol until after 12:30pm today, as you had a dose before surgery. Take your usually prescribed medications unless otherwise directed If you need a refill on your pain medication, please contact your pharmacy.  They will contact our office to request authorization.  Prescriptions will not be filled after 5pm or on week-ends. You should eat very light the first 24 hours after surgery, such as soup, crackers, pudding, etc.  Resume your normal diet the day after surgery. Most patients will experience some swelling and bruising in the breast.  Ice packs and a good support bra will help.  Swelling and bruising can take several days to resolve.  It is common to experience some constipation if taking pain medication after surgery.  Increasing fluid intake and taking a stool softener will usually help or prevent this problem from occurring.  A mild laxative (Milk of Magnesia or Miralax) should be taken according to package directions if there are no bowel movements after 48 hours. Unless discharge instructions indicate otherwise, you may remove your bandages 24-48 hours after surgery, and you may shower at that time.  You may have steri-strips (small skin tapes) in place directly over the incision.  These strips should be left on the skin for 7-10 days.  If your surgeon used skin glue on the incision, you may  shower in 24 hours.  The glue will flake off over the next 2-3 weeks.  Any sutures or staples will be removed at the office during your follow-up visit. ACTIVITIES:  You may resume regular daily activities (gradually increasing) beginning the next day.  Wearing a good support bra or sports bra minimizes pain and swelling.  You may have sexual intercourse when it is comfortable. You may drive when you no longer are taking prescription pain medication, you can comfortably wear a seatbelt, and you can safely maneuver your car and apply brakes. RETURN TO WORK:  ______________________________________________________________________________________ Dennis Bast should see your doctor in the office for a follow-up appointment approximately two weeks after your surgery.  Your doctor's nurse will typically make your follow-up appointment when she calls you with your pathology report.  Expect your pathology report 2-3 business days after your surgery.  You may call to check if you do not hear from [place] after three days. OTHER INSTRUCTIONS: _______________________________________________________________________________________________ _____________________________________________________________________________________________________________________________________ _____________________________________________________________________________________________________________________________________ _____________________________________________________________________________________________________________________________________  WHEN TO CALL YOUR DOCTOR: Fever over 101.0 Nausea and/or vomiting. Extreme swelling or bruising. Continued bleeding from incision. Increased pain, redness, or drainage from the incision.  The clinic staff is available to answer your questions during regular business hours.  Please don't hesitate to call and ask to speak to one of the nurses for clinical concerns.  If you have a medical emergency,  go to the nearest emergency room or call 911.  A surgeon from [facility] is always on call at the hospital.  For further questions, please visit [email or web address]  Care Instructions  Activity: Get plenty of rest for the remainder of the day. A responsible individual must stay with you for 24 hours following the procedure.  For the next 24 hours, DO NOT: -Drive a car -Paediatric nurse -Drink alcoholic beverages -Take any medication unless instructed by your physician -Make any legal decisions or sign important papers.  Meals: Start with liquid foods such as gelatin or soup. Progress to regular foods as tolerated. Avoid greasy, spicy, heavy foods. If nausea and/or vomiting occur, drink only clear liquids until the nausea and/or vomiting subsides. Call your physician if vomiting continues.  Special Instructions/Symptoms: Your throat may feel dry or sore from the anesthesia or the breathing tube placed in your throat during surgery. If this causes discomfort, gargle with warm salt water. The discomfort should disappear within 24 hours.  If you had a scopolamine patch placed behind your ear for the management of post- operative nausea and/or vomiting:  1. The medication in the patch is effective for 72 hours, after which it should be removed.  Wrap patch in a tissue and discard in the trash. Wash hands thoroughly with soap and water. 2. You may remove the patch earlier than 72 hours if you experience unpleasant side effects which may include dry mouth, dizziness or visual disturbances. 3. Avoid touching the patch. Wash your hands with soap and water after contact with the patch.

## 2020-12-27 ENCOUNTER — Encounter (HOSPITAL_BASED_OUTPATIENT_CLINIC_OR_DEPARTMENT_OTHER): Payer: Self-pay | Admitting: Surgery

## 2020-12-27 LAB — SURGICAL PATHOLOGY

## 2021-04-10 ENCOUNTER — Encounter (HOSPITAL_COMMUNITY): Payer: Self-pay

## 2021-06-11 ENCOUNTER — Encounter: Payer: Self-pay | Admitting: Gastroenterology

## 2021-07-13 ENCOUNTER — Ambulatory Visit (AMBULATORY_SURGERY_CENTER): Payer: 59

## 2021-07-13 VITALS — Ht 61.0 in | Wt 120.0 lb

## 2021-07-13 DIAGNOSIS — Z1211 Encounter for screening for malignant neoplasm of colon: Secondary | ICD-10-CM

## 2021-07-13 MED ORDER — NA SULFATE-K SULFATE-MG SULF 17.5-3.13-1.6 GM/177ML PO SOLN: 1.0000 | Freq: Once | ORAL | 0 refills | Status: AC

## 2021-07-13 NOTE — Progress Notes (Signed)
No egg or soy allergy known to patient  No issues known to pt with past sedation with any surgeries or procedures Patient denies ever being told they had issues or difficulty with intubation  No FH of Malignant Hyperthermia Pt is not on diet pills Pt is not on home 02  Pt is not on blood thinners  Pt denies issues with constipation at this time; No A fib or A flutter NO PA's for preps discussed with pt in PV today  Discussed with pt there will be an out-of-pocket cost for prep and that varies from $0 to 70 + dollars - pt verbalized understanding  Pt instructed to use Singlecare.com or GoodRx for a price reduction on prep  PV completed over the phone. Pt verified name, DOB, address and insurance during PV today.  Pt encouraged to call with questions or issues. Pt has My chart, procedure instructions sent via My Chart per patient request.

## 2021-08-03 ENCOUNTER — Other Ambulatory Visit: Payer: Self-pay | Admitting: Internal Medicine

## 2021-08-03 DIAGNOSIS — E785 Hyperlipidemia, unspecified: Secondary | ICD-10-CM

## 2021-08-07 ENCOUNTER — Encounter: Payer: Self-pay | Admitting: Gastroenterology

## 2021-08-10 ENCOUNTER — Ambulatory Visit (AMBULATORY_SURGERY_CENTER): Payer: 59 | Admitting: Gastroenterology

## 2021-08-10 ENCOUNTER — Encounter: Payer: Self-pay | Admitting: Gastroenterology

## 2021-08-10 VITALS — BP 94/56 | HR 67 | Temp 97.5°F | Resp 16 | Ht 61.0 in | Wt 120.0 lb

## 2021-08-10 DIAGNOSIS — K5140 Inflammatory polyps of colon without complications: Secondary | ICD-10-CM

## 2021-08-10 DIAGNOSIS — Z1211 Encounter for screening for malignant neoplasm of colon: Secondary | ICD-10-CM | POA: Diagnosis not present

## 2021-08-10 DIAGNOSIS — D122 Benign neoplasm of ascending colon: Secondary | ICD-10-CM

## 2021-08-10 MED ORDER — SODIUM CHLORIDE 0.9 % IV SOLN: 500.0000 mL | Freq: Once | INTRAVENOUS | Status: DC

## 2021-08-10 NOTE — Progress Notes (Signed)
Report to PACU, RN, vss, BBS= Clear.  

## 2021-08-10 NOTE — Op Note (Signed)
[place] Patient Name: Denise Arellano Procedure Date: 08/10/2021 10:54 AM MRN: [id number] Endoscopist: Milus Banister , MD Age: 51 Referring MD:  Date of Birth: [birth year] Gender: Female Account #: [id number] Procedure:                Colonoscopy Indications:              Screening for colorectal malignant neoplasm Medicines:                Monitored Anesthesia Care Procedure:                Pre-Anesthesia Assessment:                           - Prior to the procedure, a History and Physical                            was performed, and patient medications and                            allergies were reviewed. The patient's tolerance of                            previous anesthesia was also reviewed. The risks                            and benefits of the procedure and the sedation                            options and risks were discussed with the patient.                            All questions were answered, and informed consent                            was obtained. Prior Anticoagulants: The patient has                            taken no previous anticoagulant or antiplatelet                            agents. ASA Grade Assessment: II - A patient with                            mild systemic disease. After reviewing the risks                            and benefits, the patient was deemed in                            satisfactory condition to undergo the procedure.                           After obtaining informed consent, the colonoscope  was passed under direct vision. Throughout the                            procedure, the patient's blood pressure, pulse, and                            oxygen saturations were monitored continuously. The                            Olympus Colonoscope [id number] was introduced through                            the anus and advanced to the the cecum, identified                            by  appendiceal orifice and ileocecal valve. The                            colonoscopy was performed without difficulty. The                            patient tolerated the procedure well. The quality                            of the bowel preparation was good. The ileocecal                            valve, appendiceal orifice, and rectum were                            photographed. Scope In: 11:00:07 AM Scope Out: 11:11:31 AM Scope Withdrawal Time: 0 hours 7 minutes 54 seconds  Total Procedure Duration: 0 hours 11 minutes 24 seconds  Findings:                 A 3 mm polyp was found in the ascending colon. The                            polyp was sessile. The polyp was removed with a                            cold snare. Resection and retrieval were complete.                           The exam was otherwise without abnormality on                            direct and retroflexion views. Complications:            No immediate complications. Estimated blood loss:                            None. Estimated Blood Loss:     Estimated blood loss: none. Impression:               -  One 3 mm polyp in the ascending colon, removed                            with a cold snare. Resected and retrieved.                           - The examination was otherwise normal on direct                            and retroflexion views. Recommendation:           - Patient has a contact number available for                            emergencies. The signs and symptoms of potential                            delayed complications were discussed with the                            patient. Return to normal activities tomorrow.                            Written discharge instructions were provided to the                            patient.                           - Resume previous diet.                           - Continue present medications.                           - Await pathology results. Milus Banister, MD 08/10/2021 11:13:26 AM This report has been signed electronically.

## 2021-08-10 NOTE — Progress Notes (Signed)
Pt's states no medical or surgical changes since previsit or office visit. 

## 2021-08-10 NOTE — Patient Instructions (Signed)
Handouts Provided:  Polyps  YOU HAD AN ENDOSCOPIC PROCEDURE TODAY AT [facility]:   Refer to the procedure report that was given to you for any specific questions about what was found during the examination.  If the procedure report does not answer your questions, please call your gastroenterologist to clarify.  If you requested that your care partner not be given the details of your procedure findings, then the procedure report has been included in a sealed envelope for you to review at your convenience later.  YOU SHOULD EXPECT: Some feelings of bloating in the abdomen. Passage of more gas than usual.  Walking can help get rid of the air that was put into your GI tract during the procedure and reduce the bloating. If you had a lower endoscopy (such as a colonoscopy or flexible sigmoidoscopy) you may notice spotting of blood in your stool or on the toilet paper. If you underwent a bowel prep for your procedure, you may not have a normal bowel movement for a few days.  Please Note:  You might notice some irritation and congestion in your nose or some drainage.  This is from the oxygen used during your procedure.  There is no need for concern and it should clear up in a day or so.  SYMPTOMS TO REPORT IMMEDIATELY:  Following lower endoscopy (colonoscopy or flexible sigmoidoscopy):  Excessive amounts of blood in the stool  Significant tenderness or worsening of abdominal pains  Swelling of the abdomen that is new, acute  Fever of 100F or higher  For urgent or emergent issues, a gastroenterologist can be reached at any hour by calling [phone]. Do not use MyChart messaging for urgent concerns.    DIET:  We do recommend a small meal at first, but then you may proceed to your regular diet.  Drink plenty of fluids but you should avoid alcoholic beverages for 24 hours.  ACTIVITY:  You should plan to take it easy for the rest of today and you should NOT DRIVE or use heavy  machinery until tomorrow (because of the sedation medicines used during the test).    FOLLOW UP: Our staff will call the number listed on your records the next business day following your procedure.  We will call around 7:15- 8:00 am to check on you and address any questions or concerns that you may have regarding the information given to you following your procedure. If we do not reach you, we will leave a message.  If you develop any symptoms (ie: fever, flu-like symptoms, shortness of breath, cough etc.) before then, please call [phone].  If you test positive for Covid 19 in the 2 weeks post procedure, please call and report this information to [place].    If any biopsies were taken you will be contacted by phone or by letter within the next 1-3 weeks.  Please call us at [phone] if you have not heard about the biopsies in 3 weeks.    SIGNATURES/CONFIDENTIALITY: You and/or your care partner have signed paperwork which will be entered into your electronic medical record.  These signatures attest to the fact that that the information above on your After Visit Summary has been reviewed and is understood.  Full responsibility of the confidentiality of this discharge information lies with you and/or your care-partner.

## 2021-08-10 NOTE — Progress Notes (Signed)
Called to room to assist during endoscopic procedure.  Patient ID and intended procedure confirmed with present staff. Received instructions for my participation in the procedure from the performing physician.  

## 2021-08-13 ENCOUNTER — Telehealth: Payer: Self-pay | Admitting: *Deleted

## 2021-08-13 NOTE — Telephone Encounter (Signed)
No answer on first attempt follow up call. Left message.  ?

## 2021-08-20 ENCOUNTER — Encounter: Payer: Self-pay | Admitting: Gastroenterology

## 2021-08-22 ENCOUNTER — Ambulatory Visit
Admission: RE | Admit: 2021-08-22 | Discharge: 2021-08-22 | Disposition: A | Discharge status: Home / Self Care | Payer: 59 | Source: Ambulatory Visit | Attending: Internal Medicine | Admitting: Internal Medicine

## 2021-08-22 DIAGNOSIS — E785 Hyperlipidemia, unspecified: Secondary | ICD-10-CM

## 2021-10-30 IMAGING — MG MM DIGITAL DIAGNOSTIC UNILAT*R* W/ TOMO W/ CAD
6 series · 6 of 18 positions shown · non-contrast
Comparison: Previous exam(s).

CLINICAL DATA: 50-year-old female presenting as a recall from
screening for possible right breast distortion. Patient has had
interval removal of her implants.

EXAM:
DIGITAL DIAGNOSTIC UNILATERAL RIGHT MAMMOGRAM WITH TOMOSYNTHESIS AND
CAD; ULTRASOUND RIGHT BREAST LIMITED
TECHNIQUE: Right digital diagnostic mammography and breast tomosynthesis was
performed. The images were evaluated with computer-aided detection.;
Targeted ultrasound examination of the right breast was performed

[R ML synth-2D]
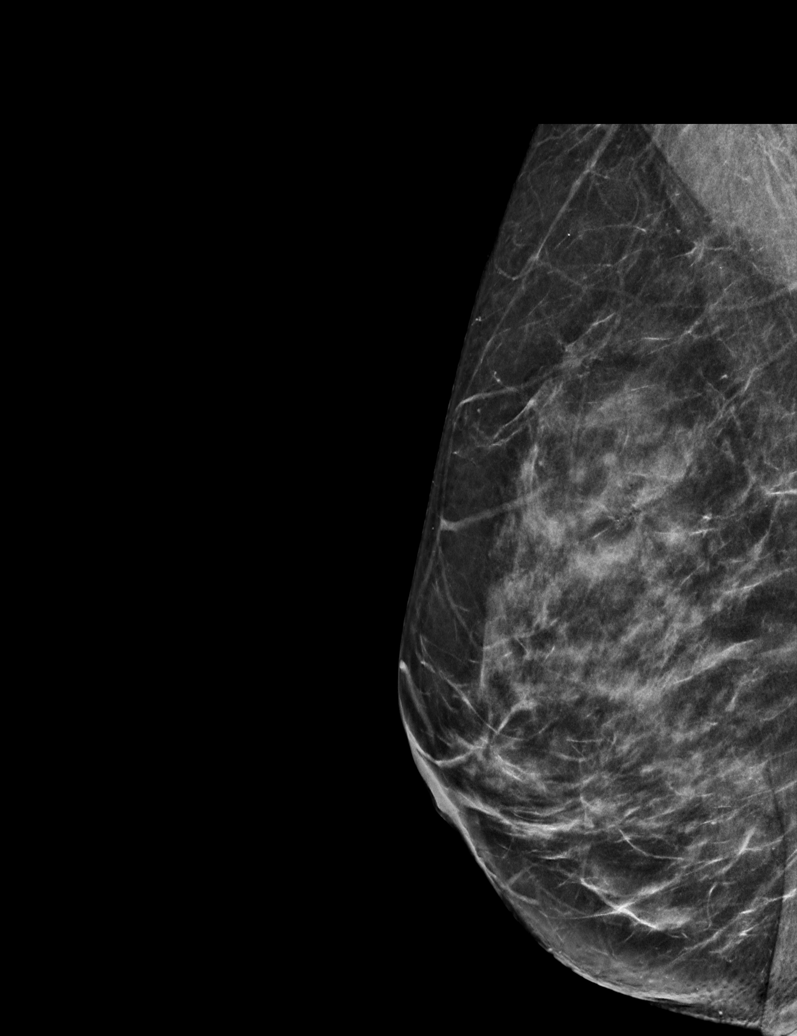

[R MLO synth-2D]
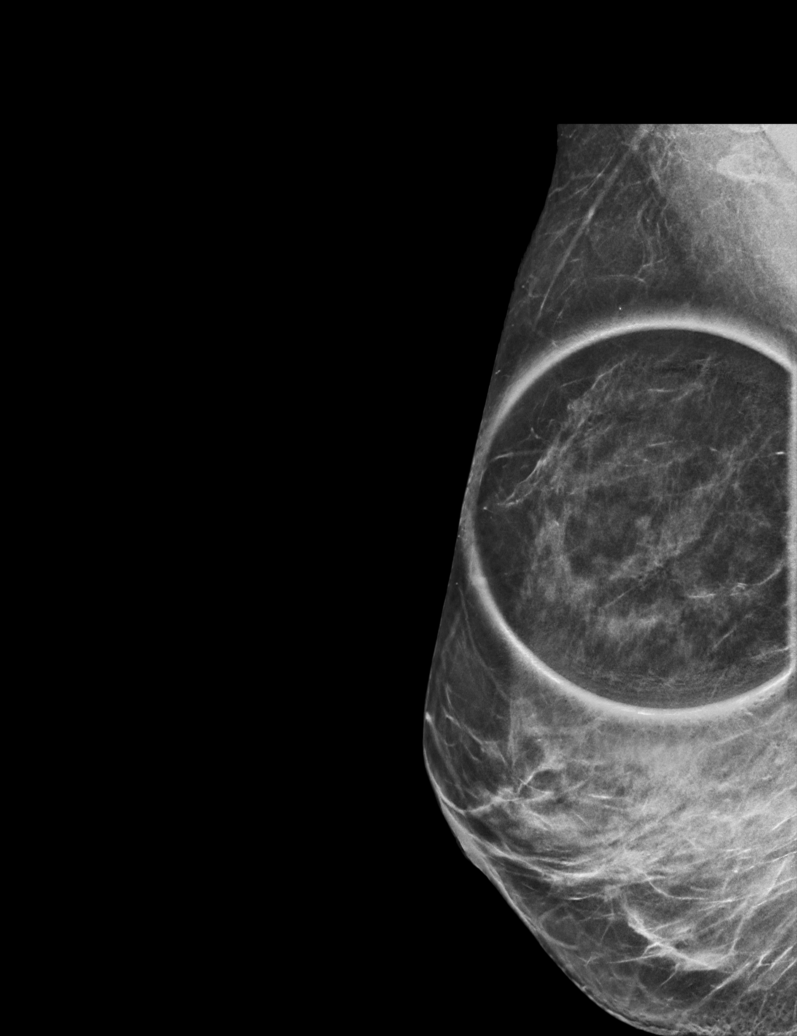

[R CC synth-2D]
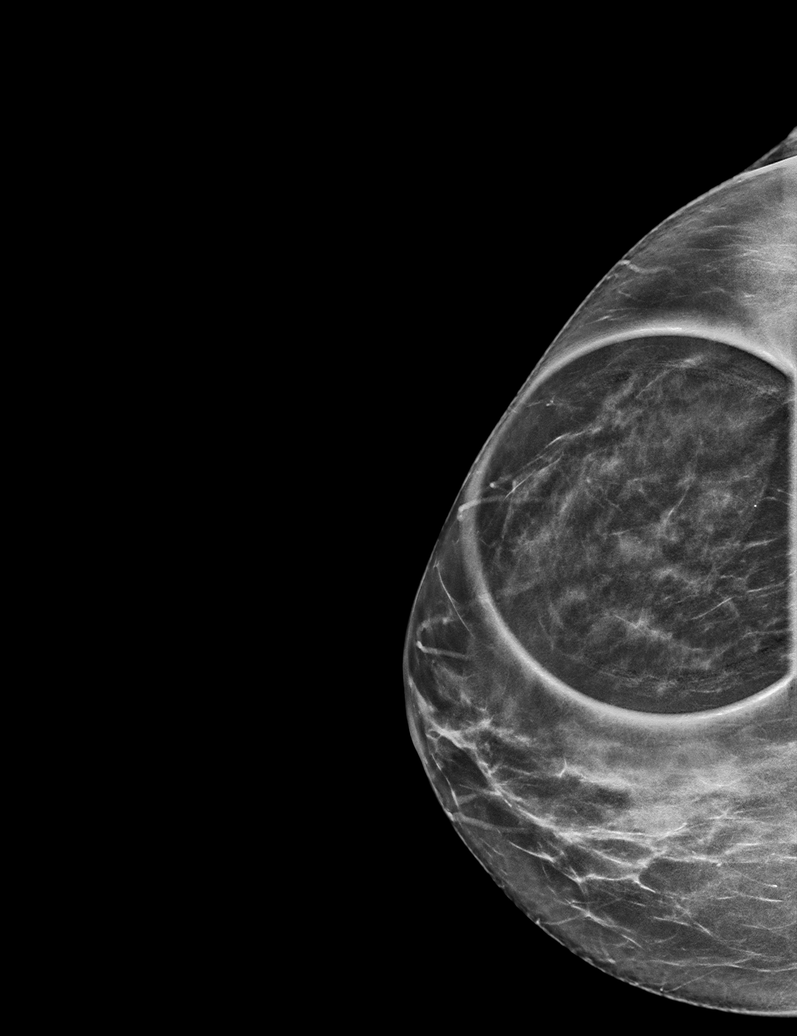

[R MLO tomo · tomo slice 29/57.0]
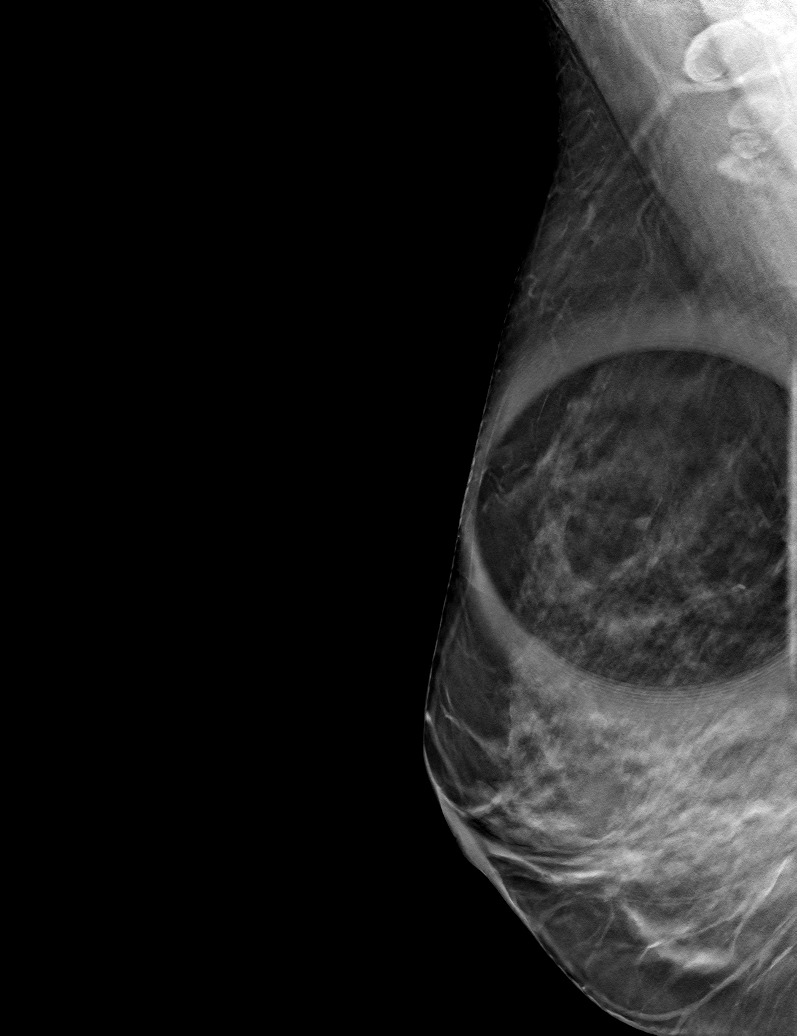

[R ML tomo · tomo slice 32/63.0]
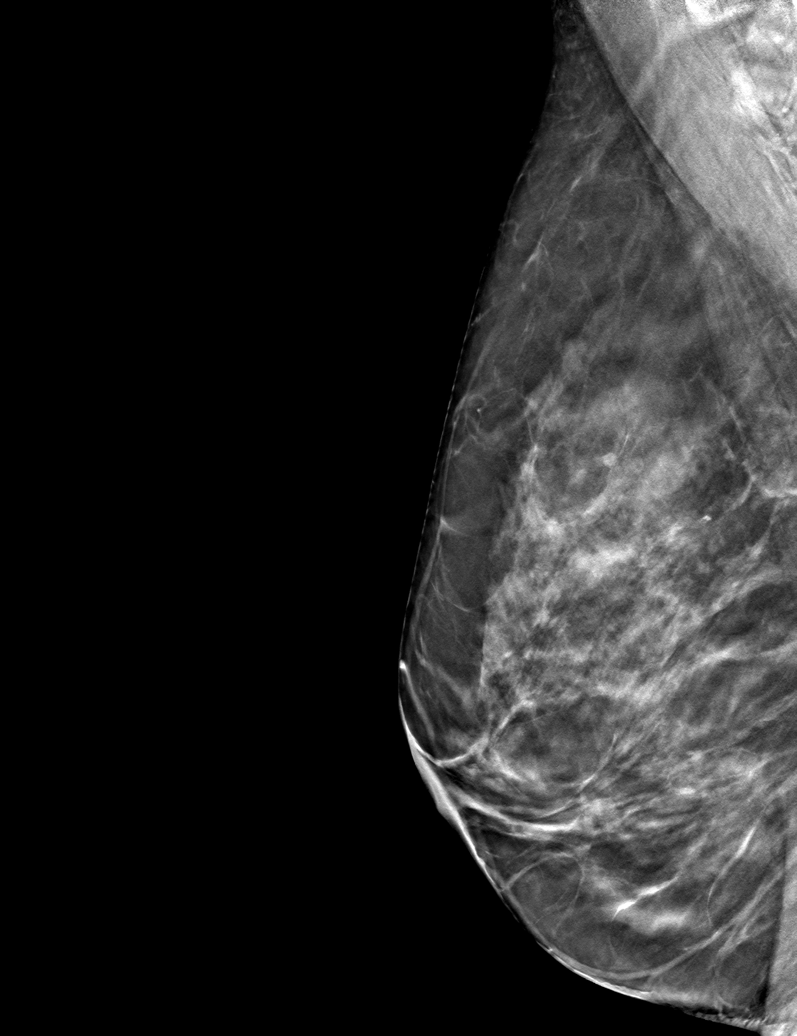

[R CC tomo · tomo slice 29/56.0]
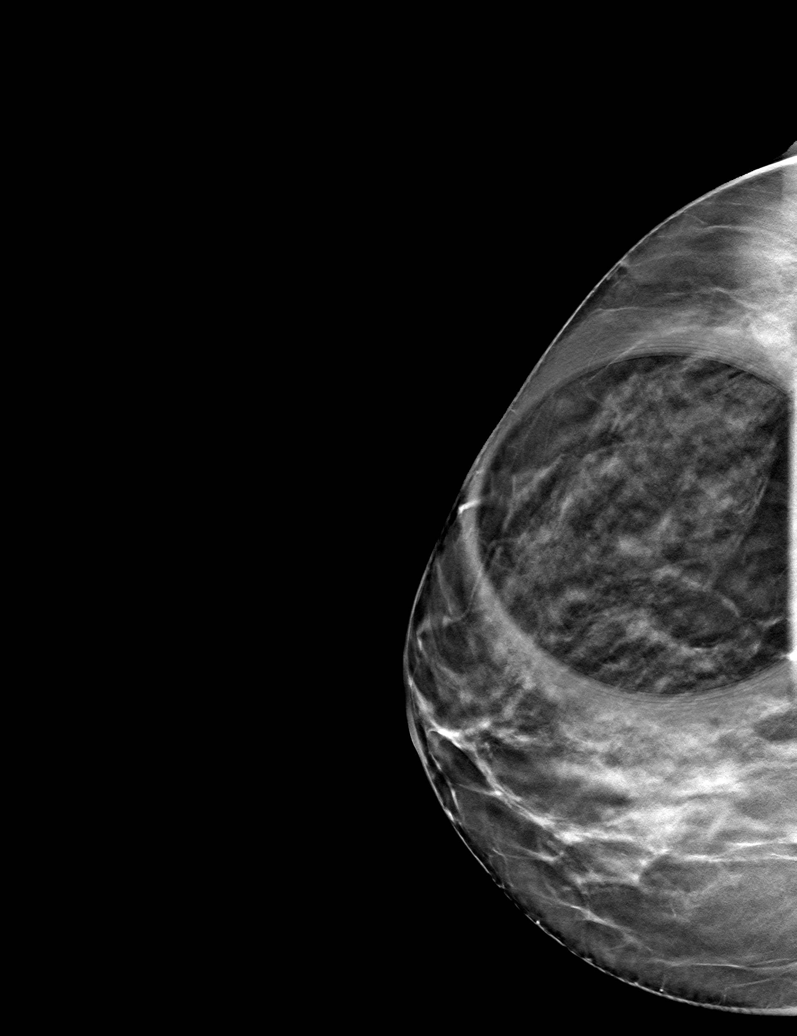

[6 of 18 positions shown; findings below may reference images not displayed]

ACR Breast Density Category c: The breast tissue is heterogeneously
dense, which may obscure small masses.
FINDINGS: Mammogram:

Right breast: Spot compression tomosynthesis and full field mL
tomosynthesis views of the right breast were performed. There is
persistence of distortion in the upper-outer right breast without a
definite mass.

Ultrasound:

Targeted ultrasound was performed throughout the upper-outer
quadrant of the right breast demonstrating no cystic or solid mass
or focal area of shadowing.

Targeted ultrasound of the right axilla demonstrates normal lymph
nodes.
IMPRESSION: Persistent distortion without sonographic correlate in the
upper-outer quadrant of the right breast. This could be related to
patient's recent implant removal surgery.

RECOMMENDATION:
Stereotactic core needle biopsy of the right breast distortion.

I have discussed the findings and recommendations with the patient
who agrees to proceed with biopsy. The patient will be scheduled for
the biopsy appointment prior to leaving the office today.

BI-RADS CATEGORY  4: Suspicious.

## 2022-08-17 ENCOUNTER — Other Ambulatory Visit: Payer: Self-pay

## 2022-08-17 ENCOUNTER — Emergency Department (HOSPITAL_BASED_OUTPATIENT_CLINIC_OR_DEPARTMENT_OTHER): Payer: 59 | Admitting: Radiology

## 2022-08-17 ENCOUNTER — Encounter (HOSPITAL_BASED_OUTPATIENT_CLINIC_OR_DEPARTMENT_OTHER): Payer: Self-pay | Admitting: Emergency Medicine

## 2022-08-17 ENCOUNTER — Emergency Department (HOSPITAL_BASED_OUTPATIENT_CLINIC_OR_DEPARTMENT_OTHER): Payer: 59

## 2022-08-17 ENCOUNTER — Emergency Department (HOSPITAL_BASED_OUTPATIENT_CLINIC_OR_DEPARTMENT_OTHER)
Admission: EM | Admit: 2022-08-17 | Discharge: 2022-08-17 | Disposition: A | Discharge status: Home / Self Care | Payer: 59 | Attending: Emergency Medicine | Admitting: Emergency Medicine

## 2022-08-17 ENCOUNTER — Emergency Department (HOSPITAL_COMMUNITY): Payer: 59

## 2022-08-17 DIAGNOSIS — R0789 Other chest pain: Secondary | ICD-10-CM | POA: Diagnosis not present

## 2022-08-17 DIAGNOSIS — J45909 Unspecified asthma, uncomplicated: Secondary | ICD-10-CM | POA: Insufficient documentation

## 2022-08-17 DIAGNOSIS — R519 Headache, unspecified: Secondary | ICD-10-CM | POA: Diagnosis not present

## 2022-08-17 DIAGNOSIS — R202 Paresthesia of skin: Secondary | ICD-10-CM | POA: Insufficient documentation

## 2022-08-17 DIAGNOSIS — R110 Nausea: Secondary | ICD-10-CM | POA: Diagnosis not present

## 2022-08-17 DIAGNOSIS — R197 Diarrhea, unspecified: Secondary | ICD-10-CM | POA: Diagnosis not present

## 2022-08-17 LAB — CBC WITH DIFFERENTIAL/PLATELET
Abs Immature Granulocytes: 0.03 10*3/uL (ref 0.00–0.07)
Basophils Absolute: 0.1 10*3/uL (ref 0.0–0.1)
Basophils Relative: 1 %
Eosinophils Absolute: 0.2 10*3/uL (ref 0.0–0.5)
Eosinophils Relative: 3 %
HCT: 42.5 % (ref 36.0–46.0)
Hemoglobin: 14.6 g/dL (ref 12.0–15.0)
Immature Granulocytes: 0 %
Lymphocytes Relative: 29 %
Lymphs Abs: 2.1 10*3/uL (ref 0.7–4.0)
MCH: 31.4 pg (ref 26.0–34.0)
MCHC: 34.4 g/dL (ref 30.0–36.0)
MCV: 91.4 fL (ref 80.0–100.0)
Monocytes Absolute: 0.5 10*3/uL (ref 0.1–1.0)
Monocytes Relative: 6 %
Neutro Abs: 4.5 10*3/uL (ref 1.7–7.7)
Neutrophils Relative %: 61 %
Platelets: 339 10*3/uL (ref 150–400)
RBC: 4.65 MIL/uL (ref 3.87–5.11)
RDW: 12.8 % (ref 11.5–15.5)
WBC: 7.4 10*3/uL (ref 4.0–10.5)
nRBC: 0 % (ref 0.0–0.2)

## 2022-08-17 LAB — COMPREHENSIVE METABOLIC PANEL
ALT: 16 U/L (ref 0–44)
AST: 14 U/L — ABNORMAL LOW (ref 15–41)
Albumin: 4.2 g/dL (ref 3.5–5.0)
Alkaline Phosphatase: 46 U/L (ref 38–126)
Anion gap: 8 (ref 5–15)
BUN: 12 mg/dL (ref 6–20)
CO2: 28 mmol/L (ref 22–32)
Calcium: 9.6 mg/dL (ref 8.9–10.3)
Chloride: 102 mmol/L (ref 98–111)
Creatinine, Ser: 0.75 mg/dL (ref 0.44–1.00)
GFR, Estimated: >60 mL/min (ref >60)
Glucose, Bld: 138 mg/dL — ABNORMAL HIGH (ref 70–99)
Potassium: 3.6 mmol/L (ref 3.5–5.1)
Sodium: 138 mmol/L (ref 135–145)
Total Bilirubin: 0.4 mg/dL (ref 0.3–1.2)
Total Protein: 7 g/dL (ref 6.5–8.1)

## 2022-08-17 LAB — TROPONIN I (HIGH SENSITIVITY)
Troponin I (High Sensitivity): 2 ng/L (ref <18)
Troponin I (High Sensitivity): 4 ng/L (ref <18)

## 2022-08-17 LAB — PREGNANCY, URINE: Preg Test, Ur: NEGATIVE

## 2022-08-17 MED ORDER — GADOBUTROL 1 MMOL/ML IV SOLN
6.0000 mL | Freq: Once | INTRAVENOUS | Status: AC | PRN
  Administered 2022-08-17: 6 mL via INTRAVENOUS

## 2022-08-17 MED ORDER — LACTATED RINGERS IV BOLUS
1000.0000 mL | Freq: Once | INTRAVENOUS | Status: AC
  Administered 2022-08-17: 1000 mL via INTRAVENOUS

## 2022-08-17 MED ORDER — IOHEXOL 350 MG/ML SOLN
100.0000 mL | Freq: Once | INTRAVENOUS | Status: AC | PRN
  Administered 2022-08-17: 75 mL via INTRAVENOUS

## 2022-08-17 MED ORDER — ONDANSETRON HCL 4 MG/2ML IJ SOLN
4.0000 mg | Freq: Once | INTRAMUSCULAR | Status: DC
  Filled 2022-08-17: qty 2

## 2022-08-17 NOTE — ED Triage Notes (Signed)
Pt 's daughter states patient  had a few episodes (about 5 times) of seeming to blank out/space out on her yesterday, lasting a minute or two.this was around 2 pm to 5 pm.

## 2022-08-17 NOTE — ED Provider Notes (Signed)
Accepted handoff at shift change from Lurena Nida PA-C. Please see prior provider note for more detail.   Briefly: Patient is 52 y.o.   DDX: concern for Pending MRI -- came in with nausea, chest tightness, diarrhea, left face and arm altered sensation. COVID since last week. Transferred from [facility], pending MRI and discharge home after. Labwork unremarkable. CT unremarkable.   Plan: I independently interpreted MR brain which shows no evidence of any acute intracranial abnormality.  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  Patient is overall feeling improved at this time, reporting that she is just quite hungry.  In context of her normal imaging and lab work as well as her improvement of symptoms I think that she is stable for discharge.  Patient understands and agrees to plan and is discharged in stable condition at this time      [place] 08/17/22 1608    Wynetta Fines, MD 08/18/22 0040

## 2022-08-17 NOTE — ED Triage Notes (Signed)
Pt had COVID last week, she had mild symptoms. This week was feeling better, had negative COVID test, but started having diarrhea,stomach upset past 2 days. Last night had episode of feeling like heart racing, then this am around 0500 she had episode of her chest feeling cold, her left arm and hand tingly, resolved and then returned.

## 2022-08-17 NOTE — ED Notes (Signed)
Carlos Levering, RN charge  nurse at [facility] ED messaged about pt coming for ED to ED for MRI.

## 2022-08-17 NOTE — ED Notes (Signed)
Awaiting patient from lobby 

## 2022-08-17 NOTE — ED Provider Notes (Signed)
[facility] [facility] AT [facility] Provider Note   CSN: [id number] Arrival date & time: 08/17/22  4098     History  No chief complaint on file.   Denise Arellano is a 52 y.o. female.  HPI     52 year old for female with a history of asthma, breast lump, celiac disease, infundibulum of the left PCA followed by Dr. Conchita Paris, hyperlipidemia, prior DVT, COVID diagnosis last week, who presents with concern for chest tightness, nausea, diarrhea, headache, altered left face and arm sensation.  Began having cold-like symptoms last Tuesday, progressively felt worse with headache, generalized weakness, had a positive COVID test later that week.  She began to feel much better towards the end of Tuesday, Wednesday Thursday.  She played tennis on Wednesday in the heat.  Thursday she tested negative for COVID.  Then yesterday, she started to feel worse.  Strange sensation that is difficult to describe.  Describes some chest tightness, as well as altered sensation like a numb feeling to the left face and left arm.  Has associated nausea, feeling of cramping prior to having diarrhea yesterday.  Had 5 episodes of diarrhea, and also loose stool today.  Just feels "off" with feeling off in her torso, strange sensation, as well as the altered sensation to her left face and arm.  She is also had a headache that began after she had played tennis on Wednesday in the heat.  Reports that the headache was severe, however she attributed it to dehydration and COVID-19.  The headache had improved some initially, but worsened slowly again.  She has had nausea, but no vomiting.  No urinary symptoms.  No dyspnea or cough.   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 Dr Conchita Paris   Hyperlipidemia    on meds   Lipoma of back    Seasonal allergies      Home Medications Prior to Admission medications   Medication Sig Start Date End Date Taking? Authorizing Provider  BREO ELLIPTA 100-25 MCG/ACT AEPB Inhale 1 puff into the lungs daily. 01/16/21   [provider]  Cetirizine HCl 10 MG CAPS Take 1 tablet by mouth daily as needed.    [provider]  Multiple Vitamin (MULTIVITAMIN PO) Take 1 tablet by mouth daily at 6 (six) AM. Le-Vel Thrive    [provider]  rosuvastatin (CRESTOR) 10 MG tablet Take 10 mg by mouth daily.    [provider]      Allergies    Aspirin, Fd&c yellow #5 (tartrazine), Ibuprofen, and Nsaids    Review of Systems   Review of Systems  Physical Exam Updated Vital Signs BP 130/83   Pulse 68   Temp 98.4 F (36.9 C) (Oral)   Resp 15   SpO2 100%  Physical Exam Vitals and nursing note reviewed.  Constitutional:      General: She is not in acute distress.    Appearance: Normal appearance. She is well-developed. She is not ill-appearing or diaphoretic.  HENT:     Head: Normocephalic and atraumatic.  Eyes:     General: No visual field deficit.  Extraocular Movements: Extraocular movements intact.     Conjunctiva/sclera: Conjunctivae normal.     Pupils: Pupils are equal, round, and reactive to light.  Cardiovascular:     Rate and Rhythm: Normal rate and regular rhythm.     Pulses: Normal pulses.     Heart sounds: Normal heart sounds. No murmur heard.    No friction rub. No gallop.  Pulmonary:     Effort: Pulmonary effort is normal. No respiratory distress.     Breath sounds: Normal breath sounds. No wheezing or rales.  Abdominal:     General: There is no distension.     Palpations: Abdomen is soft.     Tenderness: There is no abdominal tenderness. There is no guarding.  Musculoskeletal:        General: No swelling or tenderness.     Cervical back: Normal  range of motion.  Skin:    General: Skin is warm and dry.     Findings: No erythema or rash.  Neurological:     General: No focal deficit present.     Mental Status: She is alert and oriented to person, place, and time.     GCS: GCS eye subscore is 4. GCS verbal subscore is 5. GCS motor subscore is 6.     Cranial Nerves: No cranial nerve deficit, dysarthria or facial asymmetry.     Sensory: Sensory deficit (altered sensation to face and arm on left) present.     Motor: No weakness or tremor.     Coordination: Coordination normal. Finger-Nose-Finger Test normal.     Gait: Gait normal.     ED Results / Procedures / Treatments   Labs (all labs ordered are listed, but only abnormal results are displayed) Labs Reviewed  COMPREHENSIVE METABOLIC PANEL - Abnormal; Notable for the following components:      Result Value   Glucose, Bld 138 (*)    AST 14 (*)    All other components within normal limits  CBC WITH DIFFERENTIAL/PLATELET  PREGNANCY, URINE  TROPONIN I (HIGH SENSITIVITY)  TROPONIN I (HIGH SENSITIVITY)    EKG EKG Interpretation Date/Time:  Saturday August 17 2022 07:25:34 EDT Ventricular Rate:  60 PR Interval:  133 QRS Duration:  88 QT Interval:  420 QTC Calculation: 420 R Axis:   46  Text Interpretation: Sinus rhythm No previous ECGs available Confirmed by Alvira Monday ([id number]) on 08/17/2022 8:15:02 AM  Radiology CT ANGIO HEAD NECK W WO CM  Result Date: 08/17/2022 CLINICAL DATA:  Headache with neuro deficit.  COVID last week EXAM: CT ANGIOGRAPHY HEAD AND NECK WITH AND WITHOUT CONTRAST TECHNIQUE: Multidetector CT imaging of the head and neck was performed using the standard protocol during bolus administration of intravenous contrast. Multiplanar CT image reconstructions and MIPs were obtained to evaluate the vascular anatomy. Carotid stenosis measurements (when applicable) are obtained utilizing NASCET criteria, using the distal internal carotid diameter as the  denominator. RADIATION DOSE REDUCTION: This exam was performed according to the departmental dose-optimization program which includes automated exposure control, adjustment of the mA and/or kV according to patient size and/or use of iterative reconstruction technique. CONTRAST:  75mL OMNIPAQUE IOHEXOL 350 MG/ML SOLN COMPARISON:  Catheter angiogram report 08/25/2020. intracranial MRA 05/02/2020 FINDINGS: CT HEAD FINDINGS Brain: No evidence of acute infarction, hemorrhage, hydrocephalus, extra-axial collection or mass lesion/mass effect. Vascular: No hyperdense vessel or unexpected calcification. Skull: Normal. Negative for fracture or focal lesion. Sinuses/Orbits: No acute finding. Review of the MIP images confirms the above findings CTA NECK FINDINGS  Aortic arch: Normal with 3 vessel branching Right carotid system: Vessels are smoothly contoured and widely patent. 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 Left carotid system: Vessels are smoothly contoured and widely patent. Vertebral arteries: Vessels are smoothly contoured and widely patent. Very dominant right vertebral artery Skeleton: Unremarkable Other neck: Unremarkable Upper chest: Clear apical lungs Review of the MIP images confirms the above findings CTA HEAD FINDINGS Anterior circulation: No significant stenosis, proximal occlusion, aneurysm, or vascular malformation. Unchanged infundibulum at the bilateral PCOM origin from the ICA. Posterior circulation: No significant stenosis, proximal occlusion, aneurysm, or vascular malformation. Venous sinuses: As permitted by contrast timing, patent. Anatomic variants: Hypoplastic right A1 segment Review of the MIP images confirms the above findings IMPRESSION: Unremarkable CTA of the head and neck.  No explanation for headache. Electronically Signed   By: Tiburcio Pea M.D.   On: 08/17/2022 09:10   DG Chest 2 View  Result Date: 08/17/2022 CLINICAL DATA:  Chest pain EXAM: CHEST - 2 VIEW  COMPARISON:  None Available. FINDINGS: Normal heart size and mediastinal contours. There is no edema, consolidation, effusion, or pneumothorax. Artifact from EKG leads IMPRESSION: No active cardiopulmonary disease. Electronically Signed   By: Tiburcio Pea M.D.   On: 08/17/2022 08:07    Procedures Procedures    Medications Ordered in ED Medications  ondansetron (ZOFRAN) injection 4 mg (4 mg Intravenous Not Given 08/17/22 0853)  lactated ringers bolus 1,000 mL (1,000 mLs Intravenous New Bag/Given 08/17/22 0853)  iohexol (OMNIPAQUE) 350 MG/ML injection 100 mL (75 mLs Intravenous Contrast Given 08/17/22 0817)    ED Course/ Medical Decision Making/ A&P                              52 year old for female with a history of asthma, breast lump, celiac disease, infundibulum of the left PCA followed by Dr. Conchita Paris, hyperlipidemia, prior DVT, COVID diagnosis last week, who presents with concern for chest tightness, nausea, diarrhea, headache, altered left face and arm sensation.  Differential diagnosis includes electrolyte abnormalities, arrhythmia, ACS, PE, aortic dissection, CVA, ICH, continued symptoms of COVID-19, complicated migraine.  EKG was completed and personally evaluated interpreted by me shows no acute ST changes or signs of pericarditis.  Low clinical suspicion for aortic dissection given normal bilateral upper and lower extremity pulses, no findings on chest x-ray to suggest this, description of chest pain is not consistent with this.  She is not having dyspnea, no hypoxia, no asymmetric leg swelling, have low suspicion clinically for pulmonary embolus.  Labs are completed and personally eval and interpreted by me show a negative pregnancy test, normal troponin and given duration of symptoms have low suspicion for ACS, CMP without clinically significant electrolyte abnormalities, no anemia or leukocytosis.  Reviewed cardiac CT from July 2023 which showed no coronary artery calcification,  no low suspicion that her current chest tightness represents unstable angina.   Chest x-ray personally about interpreted by me and radiology shows no evidence of edema, consolidation, pneumothorax, normal mediastinum.  In addition to symptoms of chest tightness, nausea and diarrhea, she is also had a headache.  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 Conchita Paris which was not felt to be aneurysmal.  Given this history, as well as headache and new altered sensation on the left side, will order CTA to evaluate for  signs of thrombus, increasing size of infundibulum or bleed.   CTA completed showing no LVO, stable infudibulum, no ICH.  Reevaluated--given left sided altered sensation/numbness will transfer to [facility] for MRI, will order [facility] given prior breast cancer diagnosis. If negative, consider complicated migraine, continued symptoms of COVID. Has remained hemodynamically stable with no cardiac arrhythmia, will transfer POV.          Final Clinical Impression(s) / ED Diagnoses Final diagnoses:  Left face and left arm tingling  Acute nonintractable headache, unspecified headache type  Diarrhea, unspecified type  Chest tightness    Rx / DC Orders ED Discharge Orders     None         Alvira Monday, MD 08/17/22 1022

## 2022-08-17 NOTE — ED Triage Notes (Signed)
Pt also reports headaches since she had COVID, tylenol helps but doesn't last.

## 2022-08-17 NOTE — ED Notes (Signed)
Instructed pt on POV transfer to [facility].

## 2022-10-25 ENCOUNTER — Encounter: Payer: Self-pay | Admitting: Neurology

## 2022-10-25 ENCOUNTER — Other Ambulatory Visit: Payer: Self-pay | Admitting: Neurology

## 2022-10-25 ENCOUNTER — Ambulatory Visit: Payer: 59 | Attending: Neurology

## 2022-10-25 ENCOUNTER — Ambulatory Visit: Payer: 59 | Admitting: Neurology

## 2022-10-25 VITALS — BP 119/75 | HR 64 | Ht 61.0 in | Wt 117.2 lb

## 2022-10-25 DIAGNOSIS — R200 Anesthesia of skin: Secondary | ICD-10-CM

## 2022-10-25 DIAGNOSIS — G459 Transient cerebral ischemic attack, unspecified: Secondary | ICD-10-CM | POA: Diagnosis not present

## 2022-10-25 DIAGNOSIS — G43109 Migraine with aura, not intractable, without status migrainosus: Secondary | ICD-10-CM

## 2022-10-25 DIAGNOSIS — R0789 Other chest pain: Secondary | ICD-10-CM

## 2022-10-25 DIAGNOSIS — R002 Palpitations: Secondary | ICD-10-CM | POA: Diagnosis not present

## 2022-10-25 DIAGNOSIS — R442 Other hallucinations: Secondary | ICD-10-CM

## 2022-10-25 DIAGNOSIS — R4701 Aphasia: Secondary | ICD-10-CM

## 2022-10-25 DIAGNOSIS — G43901 Migraine, unspecified, not intractable, with status migrainosus: Secondary | ICD-10-CM

## 2022-10-25 DIAGNOSIS — H547 Unspecified visual loss: Secondary | ICD-10-CM

## 2022-10-25 DIAGNOSIS — R531 Weakness: Secondary | ICD-10-CM

## 2022-10-25 DIAGNOSIS — H539 Unspecified visual disturbance: Secondary | ICD-10-CM

## 2022-10-25 DIAGNOSIS — R431 Parosmia: Secondary | ICD-10-CM | POA: Diagnosis not present

## 2022-10-25 MED ORDER — NURTEC 75 MG PO TBDP: 75.0000 mg | ORAL_TABLET | Freq: Every day | ORAL | Status: AC | PRN

## 2022-10-25 NOTE — Patient Instructions (Addendum)
Stoke workup: Completed the MRi brain and CTA head and neck.  Echocardiogram with bubble study - looking for anything structural in the heart that can cause symptoms or abnormalities Trans cranial doppler more sensitive for patent foramen ovale (risk factor for stroke) and associated with migraine with aura 2 week zio or similar patch for afib or abnormal cardiac rhythms (loop recorder if you want to look up) EEG - routine 1 hour in the office and then VEEG at home 48 hours  Treatments: Nurtec take it once daily as needed or every other day for prevention. Magnesium Lamictal is great for migraine with aura without headache for prevention Or we could treat with the new medications ajovy, emgality once monthly injection (not aimovig), nurtec every other day for prevention of aura and headache/migraine  Rimegepant Disintegrating Tablets What is this medication? RIMEGEPANT (ri ME je pant) prevents and treats migraines. It works by blocking a substance in the body that causes migraines. This medicine may be used for other purposes; ask your health care provider or pharmacist if you have questions. COMMON BRAND NAME(S): NURTEC ODT What should I tell my care team before I take this medication? They need to know if you have any of these conditions: Kidney disease Liver disease An unusual or allergic reaction to rimegepant, other medications, foods, dyes, or preservatives Pregnant or trying to get pregnant Breast-feeding How should I use this medication? Take this medication by mouth. Take it as directed on the prescription label. Leave the tablet in the sealed pack until you are ready to take it. With dry hands, open the pack and gently remove the tablet. If the tablet breaks or crumbles, throw it away. Use a new tablet. Place the tablet in the mouth and allow it to dissolve. Then, swallow it. Do not cut, crush, or chew this medication. You do not need water to take this medication. Talk to your  care team about the use of this medication in children. Special care may be needed. Overdosage: If you think you have taken too much of this medicine contact a poison control center or emergency room at once. NOTE: This medicine is only for you. Do not share this medicine with others. What if I miss a dose? This does not apply. This medication is not for regular use. What may interact with this medication? Certain medications for fungal infections, such as fluconazole, itraconazole Rifampin This list may not describe all possible interactions. Give your health care provider a list of all the medicines, herbs, non-prescription drugs, or dietary supplements you use. Also tell them if you smoke, drink alcohol, or use illegal drugs. Some items may interact with your medicine. What should I watch for while using this medication? Visit your care team for regular checks on your progress. Tell your care team if your symptoms do not start to get better or if they get worse. What side effects may I notice from receiving this medication? Side effects that you should report to your care team as soon as possible: Allergic reactions--skin rash, itching, hives, swelling of the face, lips, tongue, or throat Side effects that usually do not require medical attention (report to your care team if they continue or are bothersome): Nausea Stomach pain This list may not describe all possible side effects. Call your doctor for medical advice about side effects. You may report side effects to FDA at 1-800-FDA-1088. Where should I keep my medication? Keep out of the reach of children and pets. Store at room  temperature between 20 and 25 degrees C (68 and 77 degrees F). Get rid of any unused medication after the expiration date. To get rid of medications that are no longer needed or have expired: Take the medication to a medication take-back program. Check with your pharmacy or law enforcement to find a location. If  you cannot return the medication, check the label or package insert to see if the medication should be thrown out in the garbage or flushed down the toilet. If you are not sure, ask your care team. If it is safe to put it in the trash, take the medication out of the container. Mix the medication with cat litter, dirt, coffee grounds, or other unwanted substance. Seal the mixture in a bag or container. Put it in the trash. NOTE: This sheet is a summary. It may not cover all possible information. If you have questions about this medicine, talk to your doctor, pharmacist, or health care provider.  2024 Elsevier/Gold Standard (2021-03-21 00:00:00) Migraine Headache A migraine headache is an intense pulsing or throbbing pain on one or both sides of the head. Migraine headaches may also cause other symptoms, such as nausea, vomiting, and sensitivity to light and noise. A migraine headache can last from 4 hours to 3 days. Talk with your health care provider about what things may bring on (trigger) your migraine headaches. What are the causes? The exact cause is not known. However, a migraine may be caused when nerves in the brain get irritated and release chemicals that cause blood vessels to become inflamed. This inflammation causes pain. Migraines may be triggered or caused by: Smoking. Medicines, such as: Nitroglycerin, which is used to treat chest pain. Birth control pills. Estrogen. Certain blood pressure medicines. Foods or drinks that contain nitrates, glutamate, aspartame, MSG, or tyramine. Certain foods or drinks, such as aged cheeses, chocolate, alcohol, or caffeine. Doing physical activity that is very hard. Other triggers may include: Menstruation. Pregnancy. Hunger. Stress. Getting too much or too little sleep. Weather changes. Tiredness (fatigue). What increases the risk? The following factors may make you more likely to have migraine headaches: Being between the ages of 71-55 years  old. Being female. Having a family history of migraine headaches. Being Caucasian. Having a mental health condition, such as depression or anxiety. Being obese. What are the signs or symptoms? The main symptom of this condition is pulsing or throbbing pain. This pain may: Happen in any area of the head, such as on one or both sides. Make it hard to do daily activities. Get worse with physical activity. Get worse around bright lights, loud noises, or smells. Other symptoms may include: Nausea. Vomiting. Dizziness. Before a migraine headache starts, you may get warning signs (an aura). An aura may include: Seeing flashing lights or having blind spots. Seeing bright spots, halos, or zigzag lines. Having tunnel vision or blurred vision. Having numbness or a tingling feeling. Having trouble talking. Having muscle weakness. After a migraine ends, you may have symptoms. These may include: Feeling tired. Trouble concentrating. How is this diagnosed? A migraine headache can be diagnosed based on: Your symptoms. A physical exam. Tests, such as: A CT scan or an MRI of the head. These tests can help rule out other causes of headaches. Taking fluid from the spine (lumbar puncture) to examine it (cerebrospinal fluid analysis, or CSF analysis). How is this treated? This condition may be treated with medicines that: Relieve pain and nausea. Prevent migraines. Treatment may also include: Acupuncture. Lifestyle  changes like avoiding foods that trigger migraine headaches. Learning ways to control your body (biofeedback). Talk therapy to help you know and deal with negative thoughts (cognitive behavioral therapy). Follow these instructions at home: Medicines Take over-the-counter and prescription medicines only as told by your provider. Ask your provider if the medicine prescribed to you: Requires you to avoid driving or using machinery. Can cause constipation. You may need to take these  actions to prevent or treat constipation: Drink enough fluid to keep your pee (urine) pale yellow. Take over-the-counter or prescription medicines. Eat foods that are high in fiber, such as beans, whole grains, and fresh fruits and vegetables. Limit foods that are high in fat and processed sugars, such as fried or sweet foods. Lifestyle  Do not drink alcohol. Do not use any products that contain nicotine or tobacco. These products include cigarettes, chewing tobacco, and vaping devices, such as e-cigarettes. If you need help quitting, ask your provider. Get 7-9 hours of sleep each night, or the amount recommended by your provider. Find ways to manage stress, such as meditation, deep breathing, or yoga. Try to exercise regularly. This can help lessen how bad and how often your migraines occur. General instructions Keep a journal to find out what triggers your migraines, so you can avoid those things. For example, write down: What you eat and drink. How much sleep you get. Any change to your diet or medicines. If you have a migraine headache: Avoid things that make your symptoms worse, such as bright lights. Lie down in a dark, quiet room. Do not drive or use machinery. Ask your provider what activities are safe for you while you have symptoms. Keep all follow-up visits. Your provider will monitor your symptoms and recommend any further treatment. Where to find more information Coalition for Headache and Migraine Patients (CHAMP): headachemigraine.org American Migraine Foundation: americanmigrainefoundation.org National Headache Foundation: headaches.org Contact a health care provider if: You have symptoms that are different or worse than your usual migraine headache symptoms. You have more than 15 days of headaches in one month. Get help right away if: Your migraine headache becomes severe or lasts more than 72 hours. You have a fever or stiff neck. You have vision loss. Your muscles  feel weak or like you cannot control them. You lose your balance often or have trouble walking. You faint. You have a seizure. This information is not intended to replace advice given to you by your health care provider. Make sure you discuss any questions you have with your health care provider. Document Revised: 09/24/2021 Document Reviewed: 09/24/2021 Elsevier Patient Education  2024 Elsevier Inc.   Atrial Fibrillation Atrial fibrillation (AFib) is a type of heartbeat that is irregular or fast. If you have AFib, your heart beats without any order. This makes it hard for your heart to pump blood in a normal way. AFib may come and go, or it may become a long-lasting problem. If AFib is not treated, it can put you at higher risk for stroke, heart failure, and other heart problems. What are the causes? AFib may be caused by diseases that damage the heart's electrical system. They include: High blood pressure. Heart failure. Heart valve diseases. Heart surgery. Diabetes. Thyroid disease. Kidney disease. Lung diseases, such as pneumonia or COPD. Sleep apnea. Sometimes the cause is not known. What increases the risk? You are more likely to develop AFib if: You are older. You exercise often and very hard. You have a family history of AFib. You are  female. Bonita Quin are Caucasian. You are overweight. You smoke. You drink a lot of alcohol. What are the signs or symptoms? Common symptoms of this condition include: A feeling that your heart is beating very fast. Chest pain or discomfort. Feeling short of breath. Suddenly feeling light-headed or weak. Getting tired easily during activity. Fainting. Sweating. In some cases, there are no symptoms. How is this treated? Medicines to: Prevent blood clots. Treat heart rate or heart rhythm problems. Using devices, such as a pacemaker, to correct heart rhythm problems. Doing surgery to remove the part of the heart that sends bad  signals. Closing an area where clots can form in the heart (left atrial appendage). In some cases, your doctor will treat other underlying conditions. Follow these instructions at home: Medicines Take over-the-counter and prescription medicines only as told by your doctor. Do not take any new medicines without first talking to your doctor. If you are taking blood thinners: Talk with your doctor before taking aspirin or NSAIDs, such as ibuprofen. Take your medicines as told. Take them at the same time each day. Do not do things that could hurt or bruise you. Be careful to avoid falls. Wear an alert bracelet or carry a card that says you take blood thinners. Lifestyle Do not smoke or use any products that contain nicotine or tobacco. If you need help quitting, ask your doctor. Eat heart-healthy foods. Talk with your doctor about the right eating plan for you. Exercise regularly as told by your doctor. Do not drink alcohol. Lose weight if you are overweight. General instructions If you have sleep apnea, treat it as told by your doctor. Do not use diet pills unless your doctor says they are safe for you. Diet pills may make heart problems worse. Keep all follow-up visits. Your doctor will check your heart rate and rhythm regularly. Contact a doctor if: You notice a change in the speed, rhythm, or strength of your heartbeat. You are taking a blood-thinning medicine and you get more bruising. You get tired more easily when you move or exercise. You have a sudden change in weight. Get help right away if:  You have pain in your chest. You have trouble breathing. You have side effects of blood thinners, such as blood in your vomit, poop (stool), or pee (urine), or bleeding that cannot stop. You have any signs of a stroke. "BE FAST" is an easy way to remember the main warning signs: B - Balance. Dizziness, sudden trouble walking, or loss of balance. E - Eyes. Trouble seeing or a change in how  you see. F - Face. Sudden weakness or loss of feeling in the face. The face or eyelid may droop on one side. A - Arms.Weakness or loss of feeling in an arm. This happens suddenly and usually on one side of the body. S - Speech. Sudden trouble speaking, slurred speech, or trouble understanding what people say. T - Time.Time to call emergency services. Write down what time symptoms started. You have other signs of a stroke, such as: A sudden, very bad headache with no known cause. Feeling like you may vomit (nausea). Vomiting. A seizure. These symptoms may be an emergency. Get help right away. Call 911. Do not wait to see if the symptoms will go away. Do not drive yourself to the hospital. This information is not intended to replace advice given to you by your health care provider. Make sure you discuss any questions you have with your health care provider. Document  Revised: 10/17/2021 Document Reviewed: 10/17/2021 Elsevier Patient Education  2024 Elsevier Inc.   Patent Denise Arellano, Adult  A foramen ovale is a hole between the upper chambers (right atrium and left atrium) of the heart. Before you are born, it is normal to have this hole in your heart. The hole allows blood to circulate through the body without having to go through the lungs. After your birth, when you are able to breathe, you do not need the foramen ovale and it usually closes. If the hole does not close, it is called a patent foramen ovale (PFO). PFO is a common condition. Most people do not know they have this hole, and they do not have any health problems caused by it. What are the causes? The cause of this condition is not known. What are the signs or symptoms? In most cases, there are no symptoms of this condition. Possible rare symptoms include: Stroke caused by a blood clot. Migraine headaches. Platypnea-orthodeoxia syndrome. This is a condition in which a person has shortness of breath and decreased oxygen when  seated or standing but feels better when lying down. How is this diagnosed? This condition may be diagnosed based on: A physical exam and your medical history. Echocardiogram. This test uses sound waves to produce images of the heart. Transesophageal echocardiogram (TEE). This type of echocardiogram is performed by placing a probe in the part of the body that moves food from the mouth to the stomach (esophagus). Electrocardiogram (ECG). This test identifies changes in the electrical activity of the heart. Cardiac MRI. This is an imaging technique that is used to visualize the heart, if further images are needed after TEE. How is this treated? Usually, no treatment is needed. If your condition is associated with symptoms or blood clots, you may need: Medicines to prevent blood clots and strokes (anticoagulant or antiplateletmedicines). A surgical procedure to close the hole (transcatheter closure). Follow these instructions at home: Take over-the-counter and prescription medicines only as told by your health care provider. Keep all follow-up visits. This is important. Contact a health care provider if: You have a fever. You have frequent or severe headaches. Get help right away if: Your skin turns blue. You have chest pain or difficulty breathing. You have any symptoms of stroke. "BE FAST" is an easy way to remember the main warning signs of stroke: B - Balance. Signs are dizziness, sudden trouble walking, or loss of balance. E - Eyes. Signs are trouble seeing or a sudden change in vision. F - Face. Signs are sudden weakness or numbness of the face, or the face or eyelid drooping on one side. A - Arms. Signs are weakness or numbness in an arm. This happens suddenly and usually on one side of the body. S - Speech. Signs are sudden trouble speaking, slurred speech, or trouble understanding what people say. T - Time. Time to call emergency services. Write down what time symptoms started. You  have other signs of a stroke, such as: A sudden, severe headache with no known cause. Nausea or vomiting. Seizure. These symptoms may represent a serious problem that is an emergency. Do not wait to see if the symptoms will go away. Get medical help right away. Call your local emergency services (911 in the [place] Do not drive yourself to the hospital. Summary A patent foramen ovale is a hole between the upper chambers (right atrium and left atrium) of your heart. The cause of this condition is not known. You may not  know that you have a hole in your heart, and you may not have any health problems from it. Usually, no treatment is needed for this condition unless you have symptoms or blood clots. This information is not intended to replace advice given to you by your health care provider. Make sure you discuss any questions you have with your health care provider. Document Revised: 12/02/2019 Document Reviewed: 12/02/2019 Elsevier Patient Education  2024 [employer] Inc.   Ischemic Stroke  An ischemic stroke (cerebrovascular accident, CVA) occurs when an area of the brain does not get enough blood flow. This leads to the sudden death of brain tissue and can cause brain damage. An ischemic stroke is a medical emergency. It must be treated right away. What are the causes? This condition is caused by a decrease of blood flow to a part of the brain. This may be due to: A small blood clot (embolus). A buildup of plaque in the blood vessels (atherosclerosis). This blocks blood flow in the brain. An abnormal heart rhythm, called atrial fibrillation (AFib). This sends a small blood clot to the brain. A blocked or damaged artery in the head or neck. Certain infections. Inflammation of the arteries in the brain (vasculitis). Sometimes, the cause of ischemic stroke is not known. What increases the risk? The following medical conditions may increase your risk of a stroke: High blood pressure  (hypertension). Heart disease. Diabetes. High cholesterol. Obesity. Sleep problems (sleep apnea). Other risk factors that you can change include: Using products that contain nicotine or tobacco. Not being active. Heavy use of alcohol or drugs, especially cocaine and methamphetamine. Taking birth control pills, especially if you also use tobacco. Risk factors that you cannot change include: Being older than age 43. Having a history of blood clots, stroke, or mini-stroke (transient ischemic attack, TIA). Having a family history of stroke. What are the signs or symptoms? Symptoms of this condition usually develop suddenly, or you may notice them after waking from sleep. These may include: Weakness or numbness of your face, arm, or leg, especially on one side of your body. Loss of balance or coordination. Slurred speech, trouble speaking, trouble understanding speech, or a combination of these. Vision changes. You may have double vision, blurred vision, or loss of vision. Dizziness or confusion. Nausea and vomiting. Severe headache. If possible, write down the exact time your symptoms started. Tell your health care provider. If symptoms come and go, they could be signs of a TIA. Get help right away, even if you feel better. How is this diagnosed? This condition may be diagnosed based on: Your symptoms, your medical history, and a physical exam. CT scan of the brain. MRI. Imaging tests that scan blood flow in the brain (CT angiogram, MRI angiogram, or cerebral angiogram). You may also have other tests, including: Electrocardiogram (ECG). Continuous heart monitoring. Transthoracic echocardiogram (TTE). Transesophageal echocardiogram (TEE). Carotid ultrasound. Blood tests. Sleep study to check for sleep apnea. You may need to see a health care provider who specializes in stroke care. How is this treated? Treatment for this condition depends on the area of the brain affected and the  cause of your symptoms. Some treatments work better if they are done within 3-6 hours of the start of symptoms. These may include: Medicine that is injected to dissolve the blood clot. Treatments given directly to the affected artery to remove or dissolve the blood clot. Medicines to control blood pressure. Medicines to thin the blood (anticoagulant or antiplatelet). Other treatments may include:  Oxygen. IV fluids. Procedures to increase blood flow. After a stroke, you may work with physical, speech, mental health, or occupational therapists to help you recover. Follow these instructions at home: Medicines Take over-the-counter and prescription medicines only as told by your health care provider. If you were told to take a medicine to thin your blood, take it exactly as told, at the same time every day. Taking too much of a blood thinner can cause bleeding. Taking too little may not protect you against a stroke and other problems. If you were not prescribed medicines that contain aspirin or NSAIDs, such as ibuprofen, talk with your health care provider before you take any of these. These medicines increase your risk for dangerous bleeding. When taking a blood thinner, do these things: Hold pressure over any cuts that bleed for longer than usual. Tell your dentist and other health care providers that you are taking anticoagulants before having any procedures that may cause bleeding. Avoid activities that could cause injury or bruising. Wear a medical alert bracelet or carry a card that lists what medicines you take. Eating and drinking Follow instructions from your health care provider about diet. Eat healthy foods. If your stroke affected your ability to swallow, you may need to take steps to avoid choking. These may include: Taking small bites of food. Eating soft or pureed foods. Safety Follow instructions from your health care team about physical activity. Use a walker or cane as told  by your health care provider. Take steps to lower your risk of falls at home. These may include: Installing grab bars in the bedroom and bathroom. Using raised toilets and putting a seat in the shower. Removing clutter and tripping hazards, such as cords or area rugs. General instructions Do not use any products that contain nicotine or tobacco. These include cigarettes, chewing tobacco, and vaping devices, such as e-cigarettes. If you need help quitting, ask your health care provider. If you drink alcohol: Limit how much you have to: 0-1 drink a day for women who are not pregnant. 0-2 drinks a day for men. Know how much alcohol is in your drink. In the [place] one drink equals one 12 oz bottle of beer (355 mL), one 5 oz glass of wine (148 mL), or one 1 oz glass of hard liquor (44 mL). Keep all follow-up visits. This is important. How is this prevented? You can lower your risk of another stroke by managing these conditions: High blood pressure. High cholesterol. Diabetes. Heart disease. Sleep apnea. Obesity. Quitting smoking, limiting alcohol, and staying physically active also will reduce your risk. Your health care provider will continue to help you with ways to prevent short-term and long-term problems caused by stroke. Get help right away if: You have any symptoms of a stroke. "BE FAST" is an easy way to remember the main warning signs of a stroke: B - Balance. Signs are dizziness, sudden trouble walking, or loss of balance. E - Eyes. Signs are trouble seeing or a sudden change in vision. F - Face. Signs are sudden weakness or numbness of the face, or the face or eyelid drooping on one side. A - Arms. Signs are weakness or numbness in an arm. This happens suddenly and usually on one side of the body. S - Speech. Signs are sudden trouble speaking, slurred speech, or trouble understanding what people say. T - Time. Time to call emergency services. Write down what time symptoms  started. You have other signs of a stroke, such  as: A sudden, severe headache with no known cause. Nausea or vomiting. Seizure. These symptoms may represent a serious problem that is an emergency. Do not wait to see if the symptoms will go away. Get medical help right away. Call your local emergency services (911 in the [place] Do not drive yourself to the hospital. Summary An ischemic stroke (cerebrovascular accident, CVA) occurs when an area of the brain does not get enough blood flow. Symptoms of this condition usually develop suddenly, or you may notice them after waking from sleep. It is very important to get treatment at the first sign of stroke symptoms. Stroke is a medical emergency that must be treated right away. This information is not intended to replace advice given to you by your health care provider. Make sure you discuss any questions you have with your health care provider. Document Revised: 09/08/2019 Document Reviewed: 09/08/2019 Elsevier Patient Education  2022 [employer].

## 2022-10-25 NOTE — Progress Notes (Unsigned)
Enrolled patient for a 14 day Zio AT monitor to be mailed to patients home  DOD to read

## 2022-10-25 NOTE — Progress Notes (Signed)
[facility]    Provider:  Dr Lucia Gaskins Requesting Provider: Olene Floss, * Primary Care Provider:  Cleatis Polka., MD  CC:  migraines with aura vs TIA  HPI:  Denise Arellano is a 52 y.o. female here as requested by Olene Floss, * for atypical migraine. has ANEMIA, IRON DEFICIENCY; RECTAL BLEEDING; CELIAC SPRUE; Olfactory aura; Migraine with aura and without status migrainosus, not intractable; Migraine aura without headache; Possible TIA (transient ischemic attack) vs Migraine aura; Palpitations; and Other chest pain on their problem list.  On Xarelto.  Was referred from the emergency room from a July visit, EKG showed sinus rhythm, QTc 420 normal, came in with nausea, left face and arm altered sensation, in the setting of COVID.  MRI was negative intracranial abnormalities were not seen, she also stated some brain fog and sensory symptoms so she was referred to neurology for post-COVID brain fog and abnormal migraine symptoms.  She was feeling overall improved, normal imaging and lab work as well as improvement of the symptoms she was discharged.  When she was seen in the ED she had covid. A long time ago she had a whooshing sound in the ear. She had a CTA with infundibulum and looked fine on review and CTA but she felt like she was having a heart attack. An MRi of the brain was normal. She didn't have headaches she has aphasia and hemiparesis without headache, then had headaches for a week, worst headache of life. Having zaps in the brain. She was on citalopram but hadn;t taken it in the past. She has a middle daughter with epilepsy. Her daughter sees Dr. Teresa Coombs and was released from meds. She has weird brain sensations. She has a hx of migraines. She was in HS and couldn;t see the ball. She went on a diet and that did not help. She can't see, she can;t talk sometimes, she does not have a headache and then it goes away, left face and arm sensory changes, but  it may be hermigraines if she feels nervous it happened she had a DVT in theleft arm in 2021. She went gluten free and that helped. Also exercise and heat induced headache her head will feel awful. She typically has migraine aura with little to no pain in the head. Covid made it worse. She knows about stroke risk with women with migraine aura and hs never been on the pill, no hrt as well. She has perimenopause symptoms. She thinks the hot flashes made her think she was having a heart attack, burning tightness, from chest to back, her BP drops, feels like a heart attack, left arm gets numb. Her period is labile and every 2 weeks new going to see obgyn soon. When she does get headaches w or wo aura they can be right-sided and pulsating/pounding/throbbing, light sensitivity, worse with covid, has hadit 7-10 days. Have been fine the last few weeks. May be hormonal. May be stress. Anxiety. Hormones. Covid related. No headache in 2 weeks. she can't see, she can;t talk sometimes, she does not have a headache and then it goes away.Also had olfactory senses not there. No other focal neurologic deficits, associated symptoms, inciting events or modifiable factors.   Tried and medications for headache/migraine tried triptans: maxalt, rizatriptan, amitriptyline, topamax per patient review and history review    Reviewed notes, labs and imaging from outside physicians, which showed: 08/17/2022: CTA and MRI brain:  IMPRESSION: Unremarkable CTA of the head and neck.  No explanation for headache.   MRI brain: CLINICAL DATA:  Neuro deficit, stroke suspected. History of breast cancer.   EXAM: MRI HEAD WITHOUT AND WITH CONTRAST   TECHNIQUE: Multiplanar, multiecho pulse sequences of the brain and surrounding structures were obtained without and with intravenous contrast.   CONTRAST:  6mL GADAVIST GADOBUTROL 1 MMOL/ML IV SOLN   COMPARISON:  Same-day CT/CTA head and neck   FINDINGS: Brain: There is no acute  intracranial hemorrhage, extra-axial fluid collection, or acute infarct.   Parenchymal volume is normal. The ventricles are normal in size. Parenchymal signal is normal   The pituitary and suprasellar region are normal. There is no mass lesion or abnormal enhancement. There is no mass effect or midline shift.   Vascular: Normal flow voids.   Skull and upper cervical spine: Normal marrow signal.   Sinuses/Orbits: The paranasal sinuses are clear. The globes and orbits are unremarkable.   Other: The mastoid air cells and middle ear cavities are clear.   IMPRESSION: Normal brain MRI.     Electronically Signed   By: Lesia Hausen M.D.   On: 08/17/2022 15:45  Recent Results (from the past 2160 hour(s))  CBC with Differential     Status: None   Collection Time: 08/17/22  7:27 AM  Result Value Ref Range   WBC 7.4 4.0 - 10.5 K/uL   RBC 4.65 3.87 - 5.11 MIL/uL   Hemoglobin 14.6 12.0 - 15.0 g/dL   HCT 40.9 81.1 - 91.4 %   MCV 91.4 80.0 - 100.0 fL   MCH 31.4 26.0 - 34.0 pg   MCHC 34.4 30.0 - 36.0 g/dL   RDW 78.2 95.6 - 21.3 %   Platelets 339 150 - 400 K/uL   nRBC 0.0 0.0 - 0.2 %   Neutrophils Relative % 61 %   Neutro Abs 4.5 1.7 - 7.7 K/uL   Lymphocytes Relative 29 %   Lymphs Abs 2.1 0.7 - 4.0 K/uL   Monocytes Relative 6 %   Monocytes Absolute 0.5 0.1 - 1.0 K/uL   Eosinophils Relative 3 %   Eosinophils Absolute 0.2 0.0 - 0.5 K/uL   Basophils Relative 1 %   Basophils Absolute 0.1 0.0 - 0.1 K/uL   Immature Granulocytes 0 %   Abs Immature Granulocytes 0.03 0.00 - 0.07 K/uL    Comment: Performed at [facility], [address], [place], [place] [address]  Comprehensive metabolic panel     Status: Abnormal   Collection Time: 08/17/22  7:27 AM  Result Value Ref Range   Sodium 138 135 - 145 mmol/L   Potassium 3.6 3.5 - 5.1 mmol/L   Chloride 102 98 - 111 mmol/L   CO2 28 22 - 32 mmol/L   Glucose, Bld 138 (H) 70 - 99 mg/dL    Comment: Glucose reference range  applies only to samples taken after fasting for at least 8 hours.   BUN 12 6 - 20 mg/dL   Creatinine, Ser 8.46 0.44 - 1.00 mg/dL   Calcium 9.6 8.9 - 96.2 mg/dL   Total Protein 7.0 6.5 - 8.1 g/dL   Albumin 4.2 3.5 - 5.0 g/dL   AST 14 (L) 15 - 41 U/L   ALT 16 0 - 44 U/L   Alkaline Phosphatase 46 38 - 126 U/L   Total Bilirubin 0.4 0.3 - 1.2 mg/dL   GFR, Estimated >95 >28 mL/min    Comment: (NOTE) Calculated using the CKD-EPI Creatinine Equation (2021)    Anion gap 8 5 -  15    Comment: Performed at [facility], [address], [place], [place] [address]  Troponin I (High Sensitivity)     Status: None   Collection Time: 08/17/22  7:27 AM  Result Value Ref Range   Troponin I (High Sensitivity) 2 <18 ng/L    Comment: (NOTE) Elevated high sensitivity troponin I (hsTnI) values and significant  changes across serial measurements may suggest ACS but many other  chronic and acute conditions are known to elevate hsTnI results.  Refer to the "Links" section for chest pain algorithms and additional  guidance. Performed at [facility], [address], [place], [place] [address]   Pregnancy, urine     Status: None   Collection Time: 08/17/22  7:27 AM  Result Value Ref Range   Preg Test, Ur NEGATIVE NEGATIVE    Comment:        THE SENSITIVITY OF THIS METHODOLOGY IS >25 mIU/mL. Performed at [facility], [address], [place], [place] [address]   Troponin I (High Sensitivity)     Status: None   Collection Time: 08/17/22 10:53 AM  Result Value Ref Range   Troponin I (High Sensitivity) 4 <18 ng/L    Comment: (NOTE) Elevated high sensitivity troponin I (hsTnI) values and significant  changes across serial measurements may suggest ACS but many other  chronic and acute conditions are known to elevate hsTnI results.  Refer to the "Links" section for chest pain algorithms and additional  guidance. Performed at [facility], [address] [place], [place] [address]    From a review of records and patient history, Meds taken that can be used in migraine and headache management include: Tylenol and various analgesics over-the-counter, Celexa, Decadron injections, Zofran injections,    Review of Systems: Patient complains of symptoms per HPI as well as the following symptoms none. Pertinent negatives and positives per HPI. All others negative.   Social History   Socioeconomic History   Marital status: Married    Spouse name: Not on file   Number of children: Not on file   Years of education: Not on file   Highest education level: Not on file  Occupational History   Not on file  Tobacco Use   Smoking status: Former    Current packs/day: 0.00    Average packs/day: 0.3 packs/day for 4.0 years (1.0 ttl pk-yrs)    Types: Cigarettes    Start date: 74    Quit date: 54    Years since quitting: 28.7   Smokeless tobacco: Never  Vaping Use   Vaping status: Never Used  Substance and Sexual Activity   Alcohol use: Not Currently    Alcohol/week: 0.0 - 1.0 standard drinks of alcohol   Drug use: Never   Sexual activity: Not on file  Other Topics Concern   Not on file  Social History Narrative   Not on file   Social Determinants of Health   Financial Resource Strain: Not on file  Food Insecurity: Not on file  Transportation Needs: Not on file  Physical Activity: Not on file  Stress: Not on file  Social Connections: Not on file  Intimate Partner Violence: Not on file    Family History  Problem Relation Age of Onset   Colon polyps Mother 8   Colon polyps Father 31   Colon cancer Neg Hx    Esophageal cancer Neg Hx    Rectal cancer Neg Hx    Stomach cancer Neg  Hx     Past Medical History:  Diagnosis Date   Anemia    hx of-   Anxiety    Arm vein blood clot, left 2021   post breast explant sx   Asthma    mild- uses inhaler   Breast lump or mass    Celiac disease    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 Dr Conchita Paris   Hyperlipidemia    on meds   Lipoma of back    Seasonal allergies     Patient Active Problem List   Diagnosis Date Noted   Olfactory aura 10/25/2022   Migraine with aura and without status migrainosus, not intractable 10/25/2022   Migraine aura without headache 10/25/2022   Possible TIA (transient ischemic attack) vs Migraine aura 10/25/2022   Palpitations 10/25/2022   Other chest pain 10/25/2022   CELIAC SPRUE 06/26/2009   ANEMIA, IRON DEFICIENCY 05/23/2009   RECTAL BLEEDING 05/23/2009    Past Surgical History:  Procedure Laterality Date   BREAST LUMPECTOMY WITH RADIOACTIVE SEED LOCALIZATION Right 12/25/2020   Procedure: RIGHT BREAST LUMPECTOMY WITH RADIOACTIVE SEED LOCALIZATION;  Surgeon: Manus Rudd, MD;  Location: [facility];  Service: General;  Laterality: Right;   BREAST SURGERY     implant removal 10/25/2019   COLONOSCOPY  2011   at [facility] reported a dx of celiac dx   IR ANGIO INTRA EXTRACRAN SEL INTERNAL CAROTID BILAT MOD SED  08/25/2020   IR ANGIO VERTEBRAL SEL VERTEBRAL BILAT MOD SED  08/25/2020   LIPOMA EXCISION Right 12/25/2020   Procedure: EXCISION OF RIGHT LOWER BACK LIPOMA;  Surgeon: Manus Rudd, MD;  Location:  SURGERY CENTER;  Service: General;  Laterality: Right;   PLACEMENT OF BREAST IMPLANTS  2007   SHOULDER SURGERY Right 2020   WISDOM TOOTH EXTRACTION      Current Outpatient Medications  Medication Sig Dispense Refill   BREO ELLIPTA 100-25 MCG/ACT AEPB Inhale 1 puff into the lungs daily.     Cetirizine HCl 10 MG CAPS Take 1 tablet by mouth daily as needed.     citalopram (CELEXA) 20 MG tablet Take 20 mg by mouth daily.     FERREX 150 150 MG capsule Take 150 mg by mouth daily.     fluticasone (FLONASE) 50 MCG/ACT nasal spray Place 2 sprays into both nostrils daily.     Multiple Vitamin (MULTIVITAMIN PO)  Take 1 tablet by mouth daily at 6 (six) AM. Le-Vel Thrive     Rimegepant Sulfate (NURTEC) 75 MG TBDP Take 1 tablet (75 mg total) by mouth daily as needed. For migraines. Take as close to onset of migraine as possible. One daily maximum.     No current facility-administered medications for this visit.    Allergies as of 10/25/2022 - Review Complete 10/25/2022  Allergen Reaction Noted   Aspirin Anaphylaxis 05/08/2020   Fd&c yellow #5 (tartrazine) Anaphylaxis 04/04/2014   Ibuprofen Anaphylaxis 04/04/2014   Nsaids Anaphylaxis 05/08/2020    Vitals: BP 119/75   Pulse 64   Ht 5\' 1"  (1.549 m)   Wt 117 lb 3.2 oz (53.2 kg)   BMI 22.14 kg/m  Last Weight:  Wt Readings from Last 1 Encounters:  10/25/22 117 lb 3.2 oz (53.2 kg)   Last Height:   Ht Readings from Last 1 Encounters:  10/25/22 5\' 1"  (1.549 m)     Physical exam: Exam: Gen: NAD, conversant, well nourised, well groomed  CV: RRR, no MRG. No Carotid Bruits. No peripheral edema, warm, nontender Eyes: Conjunctivae clear without exudates or hemorrhage  Neuro: Detailed Neurologic Exam  Speech:    Speech is normal; fluent and spontaneous with normal comprehension.  Cognition:    The patient is oriented to person, place, and time;     recent and remote memory intact;     language fluent;     normal attention, concentration,     fund of knowledge Cranial Nerves:    The pupils are equal, round, and reactive to light. The fundi are normal and spontaneous venous pulsations are present. Visual fields are full to finger confrontation. Extraocular movements are intact. Trigeminal sensation is intact and the muscles of mastication are normal. The face is symmetric. The palate elevates in the midline. Hearing intact. Voice is normal. Shoulder shrug is normal. The tongue has normal motion without fasciculations.   Coordination: nml  Gait: nml  Motor Observation:    No asymmetry, no atrophy, and no involuntary  movements noted. Tone:    Normal muscle tone.    Posture:    Posture is normal. normal erect    Strength:    Strength is V/V in the upper and lower limbs.      Sensation: intact to LT     Reflex Exam:  DTR's:    Deep tendon reflexes in the upper and lower extremities are normal bilaterally.   Toes:    The toes are downgoing bilaterally.   Clonus:    Clonus is absent.    Assessment/Plan:  Patient with tia vs migraine aura needs thorough evaluation  Stoke/TIA workup: Completed the MRi brain and CTA head and neck.  Echocardiogram with bubble study - looking for anything structural in the heart that can cause symptoms or abnormalities Trans cranial doppler more sensitive for patent foramen ovale (risk factor for stroke) and associated with migraine with aura 2 week zio or similar patch for afib or abnormal cardiac rhythms (loop recorder if you want to look up) EEG - routine 1 hour in the office and then VEEG at home 48 hours  Treatments: Nurtec take it once daily as needed or every other day for prevention.Andrey Campanile NP brought to exam room) Magnesium - she has tried but it is a good aura preventative thought to stop cortical spreading depression Lamictal is great for migraine with aura without headache for prevention Or we could treat with the new medications ajovy, emgality once monthly injection (not aimovig since you have constipation), nurtec every other day for prevention of aura and headache/migraine   Orders Placed This Encounter  Procedures   Cardiac event monitor   ECHOCARDIOGRAM COMPLETE BUBBLE STUDY   EEG adult   VAS [place] TRANSCRANIAL DOPPLER W BUBBLES   Meds ordered this encounter  Medications   Rimegepant Sulfate (NURTEC) 75 MG TBDP    Sig: Take 1 tablet (75 mg total) by mouth daily as needed. For migraines. Take as close to onset of migraine as possible. One daily maximum.    Cc: Valaria Good., MD  Naomie Dean, MD  [facility] [address] [place], [place] [address]  Phone [phone] Fax [phone]

## 2022-10-28 NOTE — Progress Notes (Signed)
SAMPLES 1BOX (8tablets) NDC [id number], LOT [id number] exp 06-2024.

## 2022-10-29 DIAGNOSIS — R531 Weakness: Secondary | ICD-10-CM

## 2022-10-29 DIAGNOSIS — G459 Transient cerebral ischemic attack, unspecified: Secondary | ICD-10-CM

## 2022-10-29 DIAGNOSIS — R0789 Other chest pain: Secondary | ICD-10-CM | POA: Diagnosis not present

## 2022-10-29 DIAGNOSIS — R002 Palpitations: Secondary | ICD-10-CM | POA: Diagnosis not present

## 2022-10-29 DIAGNOSIS — R200 Anesthesia of skin: Secondary | ICD-10-CM | POA: Diagnosis not present

## 2022-10-29 DIAGNOSIS — R4701 Aphasia: Secondary | ICD-10-CM

## 2022-10-29 DIAGNOSIS — H539 Unspecified visual disturbance: Secondary | ICD-10-CM

## 2022-10-29 DIAGNOSIS — H547 Unspecified visual loss: Secondary | ICD-10-CM

## 2022-11-20 ENCOUNTER — Ambulatory Visit (HOSPITAL_COMMUNITY): Admission: RE | Admit: 2022-11-20 | Payer: 59 | Source: Ambulatory Visit

## 2022-11-20 ENCOUNTER — Ambulatory Visit (HOSPITAL_COMMUNITY): Payer: 59

## 2022-11-21 ENCOUNTER — Other Ambulatory Visit: Payer: 59 | Admitting: *Deleted

## 2023-03-10 ENCOUNTER — Telehealth: Payer: 59 | Admitting: Neurology

## 2023-03-10 ENCOUNTER — Encounter: Payer: Self-pay | Admitting: Neurology

## 2023-03-10 DIAGNOSIS — G43109 Migraine with aura, not intractable, without status migrainosus: Secondary | ICD-10-CM | POA: Diagnosis not present

## 2023-03-10 NOTE — Patient Instructions (Addendum)
Continue Magnesium Glyconate [place] [facility], [address]

## 2023-03-10 NOTE — Progress Notes (Signed)
[facility]    Provider:  Dr Lucia Gaskins Requesting Provider: Cleatis Polka., MD Primary Care Provider:  Cleatis Polka., MD  CC:  migraines with aura vs TIA  Virtual Visit via Video Note  I connected with Denise Arellano on 03/10/23 at  9:30 AM EST by a video enabled telemedicine application and verified that I am speaking with the correct person using two identifiers.  Location: Patient: home Provider: office   I discussed the limitations of evaluation and management by telemedicine and the availability of in person appointments. The patient expressed understanding and agreed to proceed.  Follow Up Instructions:    I discussed the assessment and treatment plan with the patient. The patient was provided an opportunity to ask questions and all were answered. The patient agreed with the plan and demonstrated an understanding of the instructions.   The patient was advised to call back or seek an in-person evaluation if the symptoms worsen or if the condition fails to improve as anticipated.  I provided 10 minutes of non-face-to-face time during this encounter.   Anson Fret, MD  03/10/2023: She started taking magnesium glyconate and 0 head zap things and of of her celexa. Continue Magnesium Glyconate  Patient complains of symptoms per HPI as well as the following symptoms: none . Pertinent negatives and positives per HPI. All others negative   HPI:  Denise Arellano is a 53 y.o. female here as requested by Cleatis Polka., MD for atypical migraine. has ANEMIA, IRON DEFICIENCY; RECTAL BLEEDING; CELIAC SPRUE; Olfactory aura; Migraine with aura and without status migrainosus, not intractable; Migraine aura without headache; Possible TIA (transient ischemic attack) vs Migraine aura; Palpitations; and Other chest pain on their problem list.  On Xarelto.  Was referred from the emergency room from a July visit, EKG showed sinus rhythm, QTc 420 normal, came  in with nausea, left face and arm altered sensation, in the setting of COVID.  MRI was negative intracranial abnormalities were not seen, she also stated some brain fog and sensory symptoms so she was referred to neurology for post-COVID brain fog and abnormal migraine symptoms.  She was feeling overall improved, normal imaging and lab work as well as improvement of the symptoms she was discharged.  When she was seen in the ED she had covid. A long time ago she had a whooshing sound in the ear. She had a CTA with infundibulum and looked fine on review and CTA but she felt like she was having a heart attack. An MRi of the brain was normal. She didn't have headaches she has aphasia and hemiparesis without headache, then had headaches for a week, worst headache of life. Having zaps in the brain. She was on citalopram but hadn;t taken it in the past. She has a middle daughter with epilepsy. Her daughter sees Dr. Teresa Coombs and was released from meds. She has weird brain sensations. She has a hx of migraines. She was in HS and couldn;t see the ball. She went on a diet and that did not help. She can't see, she can;t talk sometimes, she does not have a headache and then it goes away, left face and arm sensory changes, but it may be hermigraines if she feels nervous it happened she had a DVT in theleft arm in 2021. She went gluten free and that helped. Also exercise and heat induced headache her head will feel awful. She typically has migraine aura with little to no pain  in the head. Covid made it worse. She knows about stroke risk with women with migraine aura and hs never been on the pill, no hrt as well. She has perimenopause symptoms. She thinks the hot flashes made her think she was having a heart attack, burning tightness, from chest to back, her BP drops, feels like a heart attack, left arm gets numb. Her period is labile and every 2 weeks new going to see obgyn soon. When she does get headaches w or wo aura they can  be right-sided and pulsating/pounding/throbbing, light sensitivity, worse with covid, has hadit 7-10 days. Have been fine the last few weeks. May be hormonal. May be stress. Anxiety. Hormones. Covid related. No headache in 2 weeks. she can't see, she can;t talk sometimes, she does not have a headache and then it goes away.Also had olfactory senses not there. No other focal neurologic deficits, associated symptoms, inciting events or modifiable factors.   Tried and medications for headache/migraine tried triptans: maxalt, rizatriptan, amitriptyline, topamax per patient review and history review    Reviewed notes, labs and imaging from outside physicians, which showed: 08/17/2022: CTA and MRI brain:  IMPRESSION: Unremarkable CTA of the head and neck.  No explanation for headache.   MRI brain: CLINICAL DATA:  Neuro deficit, stroke suspected. History of breast cancer.   EXAM: MRI HEAD WITHOUT AND WITH CONTRAST   TECHNIQUE: Multiplanar, multiecho pulse sequences of the brain and surrounding structures were obtained without and with intravenous contrast.   CONTRAST:  6mL GADAVIST GADOBUTROL 1 MMOL/ML IV SOLN   COMPARISON:  Same-day CT/CTA head and neck   FINDINGS: Brain: There is no acute intracranial hemorrhage, extra-axial fluid collection, or acute infarct.   Parenchymal volume is normal. The ventricles are normal in size. Parenchymal signal is normal   The pituitary and suprasellar region are normal. There is no mass lesion or abnormal enhancement. There is no mass effect or midline shift.   Vascular: Normal flow voids.   Skull and upper cervical spine: Normal marrow signal.   Sinuses/Orbits: The paranasal sinuses are clear. The globes and orbits are unremarkable.   Other: The mastoid air cells and middle ear cavities are clear.   IMPRESSION: Normal brain MRI.     Electronically Signed   By: Lesia Hausen M.D.   On: 08/17/2022 15:45  No results found for this or any  previous visit (from the past 2160 hours).  From a review of records and patient history, Meds taken that can be used in migraine and headache management include: Tylenol and various analgesics over-the-counter, Celexa, Decadron injections, Zofran injections,    Review of Systems: Patient complains of symptoms per HPI as well as the following symptoms none. Pertinent negatives and positives per HPI. All others negative.   Social History   Socioeconomic History   Marital status: Married    Spouse name: Not on file   Number of children: Not on file   Years of education: Not on file   Highest education level: Not on file  Occupational History   Not on file  Tobacco Use   Smoking status: Former    Current packs/day: 0.00    Average packs/day: 0.3 packs/day for 4.0 years (1.0 ttl pk-yrs)    Types: Cigarettes    Start date: 87    Quit date: 1    Years since quitting: 29.0   Smokeless tobacco: Never  Vaping Use   Vaping status: Never Used  Substance and Sexual Activity   Alcohol  use: Not Currently    Alcohol/week: 0.0 - 1.0 standard drinks of alcohol   Drug use: Never   Sexual activity: Not on file  Other Topics Concern   Not on file  Social History Narrative   Not on file   Social Drivers of Health   Financial Resource Strain: Not on file  Food Insecurity: Not on file  Transportation Needs: Not on file  Physical Activity: Not on file  Stress: Not on file  Social Connections: Not on file  Intimate Partner Violence: Not on file    Family History  Problem Relation Age of Onset   Colon polyps Mother 2   Colon polyps Father 37   Colon cancer Neg Hx    Esophageal cancer Neg Hx    Rectal cancer Neg Hx    Stomach cancer Neg Hx     Past Medical History:  Diagnosis Date   Anemia    hx of-   Anxiety    Arm vein blood clot, left 2021   post breast explant sx   Asthma    mild- uses inhaler   Breast lump or mass    Celiac disease    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 Dr Conchita Paris   Hyperlipidemia    on meds   Lipoma of back    Seasonal allergies     Patient Active Problem List   Diagnosis Date Noted   Olfactory aura 10/25/2022   Migraine with aura and without status migrainosus, not intractable 10/25/2022   Migraine aura without headache 10/25/2022   Possible TIA (transient ischemic attack) vs Migraine aura 10/25/2022   Palpitations 10/25/2022   Other chest pain 10/25/2022   CELIAC SPRUE 06/26/2009   ANEMIA, IRON DEFICIENCY 05/23/2009   RECTAL BLEEDING 05/23/2009    Past Surgical History:  Procedure Laterality Date   BREAST LUMPECTOMY WITH RADIOACTIVE SEED LOCALIZATION Right 12/25/2020   Procedure: RIGHT BREAST LUMPECTOMY WITH RADIOACTIVE SEED LOCALIZATION;  Surgeon: Manus Rudd, MD;  Location: [facility];  Service: General;  Laterality: Right;   BREAST SURGERY     implant removal 10/25/2019   COLONOSCOPY  2011   at [facility] reported a dx of celiac dx   IR ANGIO INTRA EXTRACRAN SEL INTERNAL CAROTID BILAT MOD SED  08/25/2020   IR ANGIO VERTEBRAL SEL VERTEBRAL BILAT MOD SED  08/25/2020   LIPOMA EXCISION Right 12/25/2020   Procedure: EXCISION OF RIGHT LOWER BACK LIPOMA;  Surgeon: Manus Rudd, MD;  Location: [facility];  Service: General;  Laterality: Right;   PLACEMENT OF BREAST IMPLANTS  2007   SHOULDER SURGERY Right 2020   WISDOM TOOTH EXTRACTION      Current Outpatient Medications  Medication Sig Dispense Refill   BREO ELLIPTA 100-25 MCG/ACT AEPB Inhale 1 puff into the lungs daily.     Cetirizine HCl 10 MG CAPS Take 1 tablet by mouth daily as needed.     citalopram (CELEXA) 20 MG tablet Take 20 mg by mouth daily.     FERREX 150 150 MG capsule Take 150 mg by mouth daily.     fluticasone (FLONASE) 50 MCG/ACT nasal spray Place 2 sprays into both nostrils daily.     Multiple Vitamin (MULTIVITAMIN PO) Take 1  tablet by mouth daily at 6 (six) AM. Le-Vel Thrive     Rimegepant Sulfate (NURTEC) 75 MG TBDP Take 1 tablet (75 mg total) by mouth daily as needed. For migraines. Take as close  to onset of migraine as possible. One daily maximum.     No current facility-administered medications for this visit.    Allergies as of 03/10/2023 - Review Complete 10/25/2022  Allergen Reaction Noted   Aspirin Anaphylaxis 05/08/2020   Fd&c yellow #5 (tartrazine) Anaphylaxis 04/04/2014   Ibuprofen Anaphylaxis 04/04/2014   Nsaids Anaphylaxis 05/08/2020    Vitals: There were no vitals taken for this visit. Last Weight:  Wt Readings from Last 1 Encounters:  10/25/22 117 lb 3.2 oz (53.2 kg)   Last Height:   Ht Readings from Last 1 Encounters:  10/25/22 5\' 1"  (1.549 m)     Physical exam: Exam: Gen: NAD, conversant      CV: No palpitations or chest pain or SOB. VS: Breathing at a normal rate. Weight appears within normal limits. Not febrile. Eyes: Conjunctivae clear without exudates or hemorrhage  Neuro: Detailed Neurologic Exam  Speech:    Speech is normal; fluent and spontaneous with normal comprehension.  Cognition:    The patient is oriented to person, place, and time;     recent and remote memory intact;     language fluent;     normal attention, concentration, fund of knowledge Cranial Nerves:    The pupils are equal, round, and reactive to light. Visual fields are full Extraocular movements are intact.  The face is symmetric with normal sensation. The palate elevates in the midline. Hearing intact. Voice is normal. Shoulder shrug is normal. The tongue has normal motion without fasciculations.   Coordination: normal  Gait:    No abnormalities noted or reported  Motor Observation:   no involuntary movements noted. Tone:    Appears normal  Posture:    Posture is normal. normal erect    Strength:    Strength is anti-gravity and symmetric in the upper and lower limbs.      Sensation:  intact to LT, no reports of numbness or tingling or paresthesias         Assessment/Plan:  Patient with tia vs migraine aura   03/10/2023: She started taking magnesium glyconate and 0 head zap or headaches and not on her celexa. Continue Magnesium Glyconate  Cc: Cleatis Polka., MD,  Cleatis Polka., MD  Naomie Dean, MD  [facility] [address] [place], [place] [address]  Phone [phone] Fax [phone]

## 2023-09-22 ENCOUNTER — Other Ambulatory Visit: Payer: Self-pay | Admitting: Internal Medicine

## 2023-09-22 DIAGNOSIS — R1011 Right upper quadrant pain: Secondary | ICD-10-CM

## 2023-09-24 ENCOUNTER — Ambulatory Visit
Admission: RE | Admit: 2023-09-24 | Discharge: 2023-09-24 | Disposition: A | Discharge status: Home / Self Care | Source: Ambulatory Visit | Attending: Internal Medicine | Admitting: Internal Medicine

## 2023-09-24 DIAGNOSIS — R1011 Right upper quadrant pain: Secondary | ICD-10-CM

## 2023-11-17 ENCOUNTER — Telehealth: Payer: Self-pay | Admitting: Neurology

## 2023-11-17 NOTE — Telephone Encounter (Signed)
 Pt asking if RN is able to mail her out migraine pamphlets Dr Ines provided her

## 2023-11-18 NOTE — Telephone Encounter (Signed)
 Pamphlets mailed to pt.
